# Patient Record
Sex: Female | Born: 2000 | Race: Black or African American | Hispanic: No | Marital: Single | Smoking: Current every day smoker
Health system: Southern US, Community
[De-identification: ages and names within clinical notes are randomized; demographics above are authoritative.]

## PROBLEM LIST (undated history)

## (undated) DIAGNOSIS — I10 Essential (primary) hypertension: Secondary | ICD-10-CM

## (undated) HISTORY — DX: Essential (primary) hypertension: I10

---

## 2020-02-07 ENCOUNTER — Ambulatory Visit: Payer: Medicaid Other | Attending: Family

## 2020-02-07 DIAGNOSIS — Z23 Encounter for immunization: Secondary | ICD-10-CM

## 2020-02-07 NOTE — Progress Notes (Signed)
   Covid-19 Vaccination Clinic  Name:  Kelly Arellano    MRN: [id number] DOB: September 25, 2001  02/07/2020  [name] was observed post Covid-19 immunization for 15 minutes without incident. She was provided with Vaccine Information Sheet and instruction to access the V-Safe system.   [name] was instructed to call 911 with any severe reactions post vaccine: Marland Kitchen Difficulty breathing  . Swelling of face and throat  . A fast heartbeat  . A bad rash all over body  . Dizziness and weakness   Immunizations Administered    Name Date Dose VIS Date Route   Moderna COVID-19 Vaccine 02/07/2020 12:38 PM 0.5 mL 10/09/2019 Intramuscular   Manufacturer: Moderna   Lot: [id number]   NDC: 36644-034-74

## 2020-03-11 ENCOUNTER — Ambulatory Visit: Payer: Medicaid Other | Attending: Family

## 2020-03-11 DIAGNOSIS — Z23 Encounter for immunization: Secondary | ICD-10-CM

## 2020-03-11 NOTE — Progress Notes (Signed)
   Covid-19 Vaccination Clinic  Name:  Kelly Arellano    MRN: [id number] DOB: [birth year]  03/11/2020  Kelly Arellano was observed post Covid-19 immunization for 15 minutes without incident. She was provided with Vaccine Information Sheet and instruction to access the V-Safe system.   Kelly Arellano was instructed to call 911 with any severe reactions post vaccine: Marland Kitchen Difficulty breathing  . Swelling of face and throat  . A fast heartbeat  . A bad rash all over body  . Dizziness and weakness   Immunizations Administered    Name Date Dose VIS Date Route   Moderna COVID-19 Vaccine 03/11/2020 10:14 AM 0.5 mL 10/2019 Intramuscular   Manufacturer: Moderna   Lot: [id number]   NDC: 50413-643-83

## 2020-11-20 ENCOUNTER — Ambulatory Visit: Payer: Medicaid Other

## 2020-11-25 ENCOUNTER — Ambulatory Visit: Payer: Medicaid Other

## 2020-11-27 ENCOUNTER — Ambulatory Visit: Payer: Medicaid Other | Attending: Internal Medicine

## 2020-11-27 DIAGNOSIS — Z23 Encounter for immunization: Secondary | ICD-10-CM

## 2020-11-27 NOTE — Progress Notes (Signed)
   Covid-19 Vaccination Clinic  Name:  Kelly Arellano    MRN: [id number] DOB: 2000/12/12  11/27/2020  Kelly Arellano was observed post Covid-19 immunization for 15 minutes without incident. She was provided with Vaccine Information Sheet and instruction to access the V-Safe system.   Kelly Arellano was instructed to call 911 with any severe reactions post vaccine: Marland Kitchen Difficulty breathing  . Swelling of face and throat  . A fast heartbeat  . A bad rash all over body  . Dizziness and weakness   Immunizations Administered    Name Date Dose VIS Date Route   Moderna Covid-19 Booster Vaccine 11/27/2020  1:38 PM 0.25 mL 08/27/2020 Intramuscular   Manufacturer: Moderna   Lot: [id number]   NDC: 35009-381-82

## 2021-01-06 ENCOUNTER — Emergency Department (HOSPITAL_COMMUNITY)
Admission: EM | Admit: 2021-01-06 | Discharge: 2021-01-06 | Disposition: A | Discharge status: Home / Self Care | Payer: Medicaid Other | Attending: Emergency Medicine | Admitting: Emergency Medicine

## 2021-01-06 ENCOUNTER — Other Ambulatory Visit: Payer: Self-pay

## 2021-01-06 ENCOUNTER — Encounter (HOSPITAL_COMMUNITY): Payer: Self-pay

## 2021-01-06 DIAGNOSIS — B349 Viral infection, unspecified: Secondary | ICD-10-CM | POA: Insufficient documentation

## 2021-01-06 DIAGNOSIS — Z8669 Personal history of other diseases of the nervous system and sense organs: Secondary | ICD-10-CM | POA: Diagnosis not present

## 2021-01-06 DIAGNOSIS — R509 Fever, unspecified: Secondary | ICD-10-CM

## 2021-01-06 DIAGNOSIS — Z20822 Contact with and (suspected) exposure to covid-19: Secondary | ICD-10-CM | POA: Diagnosis not present

## 2021-01-06 DIAGNOSIS — R519 Headache, unspecified: Secondary | ICD-10-CM | POA: Diagnosis present

## 2021-01-06 LAB — SARS CORONAVIRUS 2 (TAT 6-24 HRS): SARS Coronavirus 2: NEGATIVE

## 2021-01-06 MED ORDER — KETOROLAC TROMETHAMINE 60 MG/2ML IM SOLN
60.0000 mg | Freq: Once | INTRAMUSCULAR | Status: AC
  Administered 2021-01-06: 60 mg via INTRAMUSCULAR
  Filled 2021-01-06: qty 2

## 2021-01-06 MED ORDER — ACETAMINOPHEN 325 MG PO TABS
650.0000 mg | ORAL_TABLET | Freq: Four times a day (QID) | ORAL | Status: DC | PRN
  Administered 2021-01-06: 650 mg via ORAL
  Filled 2021-01-06: qty 2

## 2021-01-06 NOTE — Discharge Instructions (Addendum)
Return if any problems.  Tylenol every 4 hours  

## 2021-01-06 NOTE — ED Triage Notes (Signed)
Patient reports migraine starting on Saturday, reports she took 800 mg Advil without relief. Denies any sick contacts.

## 2021-01-06 NOTE — ED Provider Notes (Signed)
[facility] EMERGENCY DEPARTMENT Provider Note   CSN: [id number] Arrival date & time: 01/06/21  0645     History Chief Complaint  Patient presents with  . Migraine    Kelly Arellano is a 20 y.o. female.  The history is provided by the patient. No language interpreter was used.  Migraine This is a new problem. The current episode started yesterday. The problem occurs constantly. The problem has not changed since onset.Associated symptoms include headaches. Pertinent negatives include no chest pain. Nothing aggravates the symptoms. She has tried nothing for the symptoms. The treatment provided no relief.   [facility]     No past medical history on file.  There are no problems to display for this patient.      OB History   No obstetric history on file.     No family history on file.  Social History   Tobacco Use  . Smoking status: Never Smoker  . Smokeless tobacco: Never Used  Substance Use Topics  . Alcohol use: Never  . Drug use: Never    Home Medications Prior to Admission medications   Not on File    Allergies    Patient has no known allergies.  Review of Systems   Review of Systems  Cardiovascular: Negative for chest pain.  Neurological: Positive for headaches.  All other systems reviewed and are negative.   Physical Exam Updated Vital Signs BP (!) 150/96   Pulse (!) 113   Temp (!) 102.8 F (39.3 C) (Oral)   Resp (!) 21   Ht 5\' 4"  (1.626 m)   Wt 65.8 kg   LMP 12/14/2020   SpO2 100%   BMI 24.89 kg/m   Physical Exam Vitals and nursing note reviewed.  Constitutional:      Appearance: She is well-developed and well-nourished.  HENT:     Head: Normocephalic.     Nose: Nose normal.     Mouth/Throat:     Mouth: Mucous membranes are moist.  Eyes:     Extraocular Movements: EOM normal.  Cardiovascular:     Rate and Rhythm: Normal rate and regular rhythm.  Pulmonary:     Effort:  Pulmonary effort is normal.  Abdominal:     General: There is no distension.  Musculoskeletal:        General: Normal range of motion.     Cervical back: Normal range of motion.  Skin:    General: Skin is warm.  Neurological:     General: No focal deficit present.     Mental Status: She is alert and oriented to person, place, and time.  Psychiatric:        Mood and Affect: Mood and affect and mood normal.     ED Results / Procedures / Treatments   Labs (all labs ordered are listed, but only abnormal results are displayed) Labs Reviewed  SARS CORONAVIRUS 2 (TAT 6-24 HRS)    EKG EKG Interpretation  Date/Time:  Tuesday January 06 2021 06:50:28 EST Ventricular Rate:  129 PR Interval:  152 QRS Duration: 86 QT Interval:  286 QTC Calculation: 418 R Axis:   82 Text Interpretation: Sinus tachycardia Otherwise normal ECG no prior ECG for comparison. No STEMI Confirmed by [id number] (Theda Belfast) on 01/06/2021 8:48:46 AM   Radiology No results found.  Procedures Procedures   Medications Ordered in ED Medications  acetaminophen (TYLENOL) tablet 650 mg (650 mg Oral Given 01/06/21 0729)  ketorolac (TORADOL) injection  60 mg (60 mg Intramuscular Given 01/06/21 0730)    ED Course  I have reviewed the triage vital signs and the nursing notes.  Pertinent labs & imaging results that were available during my care of the patient were reviewed by me and considered in my medical decision making (see chart for details).    MDM Rules/Calculators/A&P                          MDM:  Covid pending.  Temp decreased to 100.7.  Pt reports headache has resolved.  Pt counseled on viral illness . Final Clinical Impression(s) / ED Diagnoses Final diagnoses:  Fever, unspecified fever cause  Viral illness    Rx / DC Orders ED Discharge Orders    None    An After Visit Summary was printed and given to the patient.    Elson Areas, [place] 01/06/21 8099    Tegeler, Canary Brim, MD 01/06/21  1556

## 2021-01-06 NOTE — ED Triage Notes (Signed)
Patient noted to be tachy and febrile in triage. States she was tested for flu and covid yesterday.

## 2021-01-07 ENCOUNTER — Other Ambulatory Visit: Payer: Self-pay

## 2021-01-07 ENCOUNTER — Emergency Department (HOSPITAL_COMMUNITY)
Admission: EM | Admit: 2021-01-07 | Discharge: 2021-01-08 | Disposition: A | Discharge status: Home / Self Care | Payer: Medicaid Other | Attending: Emergency Medicine | Admitting: Emergency Medicine

## 2021-01-07 ENCOUNTER — Encounter (HOSPITAL_COMMUNITY): Payer: Self-pay

## 2021-01-07 DIAGNOSIS — E860 Dehydration: Secondary | ICD-10-CM | POA: Diagnosis not present

## 2021-01-07 DIAGNOSIS — Z20822 Contact with and (suspected) exposure to covid-19: Secondary | ICD-10-CM | POA: Diagnosis not present

## 2021-01-07 DIAGNOSIS — R112 Nausea with vomiting, unspecified: Secondary | ICD-10-CM | POA: Insufficient documentation

## 2021-01-07 DIAGNOSIS — R809 Proteinuria, unspecified: Secondary | ICD-10-CM | POA: Diagnosis not present

## 2021-01-07 DIAGNOSIS — B349 Viral infection, unspecified: Secondary | ICD-10-CM

## 2021-01-07 DIAGNOSIS — R519 Headache, unspecified: Secondary | ICD-10-CM | POA: Diagnosis present

## 2021-01-07 DIAGNOSIS — H1132 Conjunctival hemorrhage, left eye: Secondary | ICD-10-CM | POA: Insufficient documentation

## 2021-01-07 LAB — COMPREHENSIVE METABOLIC PANEL
ALT: 47 U/L — ABNORMAL HIGH (ref 0–44)
AST: 85 U/L — ABNORMAL HIGH (ref 15–41)
Albumin: 3.4 g/dL — ABNORMAL LOW (ref 3.5–5.0)
Alkaline Phosphatase: 42 U/L (ref 38–126)
Anion gap: 10 (ref 5–15)
BUN: <5 mg/dL — ABNORMAL LOW (ref 6–20)
CO2: 18 mmol/L — ABNORMAL LOW (ref 22–32)
Calcium: 8.4 mg/dL — ABNORMAL LOW (ref 8.9–10.3)
Chloride: 101 mmol/L (ref 98–111)
Creatinine, Ser: 0.8 mg/dL (ref 0.44–1.00)
GFR, Estimated: >60 mL/min (ref >60)
Glucose, Bld: 100 mg/dL — ABNORMAL HIGH (ref 70–99)
Potassium: 3.1 mmol/L — ABNORMAL LOW (ref 3.5–5.1)
Sodium: 129 mmol/L — ABNORMAL LOW (ref 135–145)
Total Bilirubin: 0.8 mg/dL (ref 0.3–1.2)
Total Protein: 6.9 g/dL (ref 6.5–8.1)

## 2021-01-07 LAB — CBC
HCT: 30.4 % — ABNORMAL LOW (ref 36.0–46.0)
Hemoglobin: 10.2 g/dL — ABNORMAL LOW (ref 12.0–15.0)
MCH: 20.8 pg — ABNORMAL LOW (ref 26.0–34.0)
MCHC: 33.6 g/dL (ref 30.0–36.0)
MCV: 62 fL — ABNORMAL LOW (ref 80.0–100.0)
Platelets: 220 10*3/uL (ref 150–400)
RBC: 4.9 MIL/uL (ref 3.87–5.11)
RDW: 18.1 % — ABNORMAL HIGH (ref 11.5–15.5)
WBC: 3.6 10*3/uL — ABNORMAL LOW (ref 4.0–10.5)
nRBC: 0 % (ref 0.0–0.2)

## 2021-01-07 LAB — URINALYSIS, ROUTINE W REFLEX MICROSCOPIC
Bilirubin Urine: NEGATIVE
Glucose, UA: NEGATIVE mg/dL
Ketones, ur: 20 mg/dL — AB
Leukocytes,Ua: NEGATIVE
Nitrite: NEGATIVE
Protein, ur: >=300 mg/dL — AB
RBC / HPF: >50 RBC/hpf — ABNORMAL HIGH (ref 0–5)
Specific Gravity, Urine: 1.025 (ref 1.005–1.030)
pH: 5 (ref 5.0–8.0)

## 2021-01-07 LAB — LIPASE, BLOOD: Lipase: 25 U/L (ref 11–51)

## 2021-01-07 LAB — RESP PANEL BY RT-PCR (FLU A&B, COVID) ARPGX2
Influenza A by PCR: NEGATIVE
Influenza B by PCR: NEGATIVE
SARS Coronavirus 2 by RT PCR: NEGATIVE

## 2021-01-07 LAB — I-STAT BETA HCG BLOOD, ED (MC, WL, AP ONLY): I-stat hCG, quantitative: <5 m[IU]/mL (ref <5)

## 2021-01-07 MED ORDER — ACETAMINOPHEN 325 MG PO TABS
650.0000 mg | ORAL_TABLET | Freq: Once | ORAL | Status: AC
  Administered 2021-01-07: 650 mg via ORAL
  Filled 2021-01-07: qty 2

## 2021-01-07 NOTE — ED Triage Notes (Signed)
Pt comes for fever, n/v, seen yesterday for the same.

## 2021-01-08 MED ORDER — POTASSIUM CHLORIDE CRYS ER 20 MEQ PO TBCR: 20.0000 meq | EXTENDED_RELEASE_TABLET | Freq: Two times a day (BID) | ORAL | 0 refills | Status: AC

## 2021-01-08 MED ORDER — POTASSIUM CHLORIDE CRYS ER 20 MEQ PO TBCR: 20.0000 meq | EXTENDED_RELEASE_TABLET | Freq: Two times a day (BID) | ORAL | 0 refills | Status: DC

## 2021-01-08 MED ORDER — ONDANSETRON HCL 4 MG/2ML IJ SOLN
4.0000 mg | Freq: Once | INTRAMUSCULAR | Status: AC
  Administered 2021-01-08: 4 mg via INTRAVENOUS
  Filled 2021-01-08: qty 2

## 2021-01-08 MED ORDER — ONDANSETRON 4 MG PO TBDP: 4.0000 mg | ORAL_TABLET | Freq: Three times a day (TID) | ORAL | 0 refills | Status: DC | PRN

## 2021-01-08 MED ORDER — SODIUM CHLORIDE 0.9 % IV BOLUS
1000.0000 mL | Freq: Once | INTRAVENOUS | Status: AC
  Administered 2021-01-08: 1000 mL via INTRAVENOUS

## 2021-01-08 NOTE — ED Provider Notes (Signed)
[facility] Provider Note   CSN: [id number] Arrival date & time: 01/07/21  2100     History Chief Complaint  Patient presents with  . Fever    Kelly Arellano is a 20 y.o. female with a history of anemia who presents to the emergency department with chief complaint of vomiting.  The patient reports multiple episodes of nonbloody, nonbilious vomiting, onset 24 hours ago.  She reports that she has been unable to keep down even sips of fluids or any food since onset.  She reports that she noticed today red dot on her left eye after vomiting began earlier today.  She reports that she developed malaise approximately 4 days ago followed by headache and fever 48 hours ago.  Fever has been well controlled by Tylenol until she started vomiting.  She reports that headache has been gradually improving since onset.  She is also endorsing mild, diffuse, bilateral low back pain.  She denies neck pain or stiffness, numbness, weakness, chest pain, shortness of breath, visual changes, cough, hematemesis, dysuria, urinary frequency or hesitancy, vaginal discharge, abdominal pain, flank pain.  She reports that she started her menstrual cycle yesterday.  No known sick contacts.  She is fully vaccinated and boosted against COVID-19. No concerns for STIs.  No history of IV drug use.   The history is provided by the patient and medical records. No language interpreter was used.       History reviewed. No pertinent past medical history.  There are no problems to display for this patient.   History reviewed. No pertinent surgical history.   OB History   No obstetric history on file.     No family history on file.  Social History   Tobacco Use  . Smoking status: Never Smoker  . Smokeless tobacco: Never Used  Substance Use Topics  . Alcohol use: Never  . Drug use: Never    Home Medications Prior to Admission medications   Medication Sig Start Date End  Date Taking? Authorizing Provider  ibuprofen (ADVIL) 800 MG tablet Take 800 mg by mouth every 8 (eight) hours as needed for headache or moderate pain.   Yes [provider]  mometasone (NASONEX) 50 MCG/ACT nasal spray Place 2 sprays into the nose daily as needed (allergies). 01/22/19  Yes [provider]  montelukast (SINGULAIR) 5 MG chewable tablet Chew 5 mg by mouth at bedtime. 04/15/20  Yes [provider]  norethindrone-ethinyl estradiol (LOESTRIN FE) 1-20 MG-MCG tablet Take 1 tablet by mouth daily. 06/19/20 06/19/21 Yes [provider]  ondansetron (ZOFRAN ODT) 4 MG disintegrating tablet Take 1 tablet (4 mg total) by mouth every 8 (eight) hours as needed. 01/08/21  Yes Amour Trigg A, PA-C  potassium chloride SA (KLOR-CON) 20 MEQ tablet Take 1 tablet (20 mEq total) by mouth 2 (two) times daily for 5 days. 01/08/21 01/13/21  Taahir Grisby A, PA-C    Allergies    Molds & smuts and Pollen extract  Review of Systems   Review of Systems  Constitutional: Positive for chills and fever. Negative for activity change and diaphoresis.  HENT: Negative for congestion, sinus pressure, sinus pain, sore throat and voice change.   Eyes: Negative for visual disturbance.  Respiratory: Negative for cough, shortness of breath and wheezing.   Cardiovascular: Negative for chest pain and palpitations.  Gastrointestinal: Positive for nausea and vomiting. Negative for abdominal pain, blood in stool, constipation and diarrhea.  Genitourinary: Negative for dysuria, flank pain, frequency,  vaginal discharge and vaginal pain.  Musculoskeletal: Positive for back pain. Negative for arthralgias, gait problem, joint swelling, myalgias and neck stiffness.  Skin: Negative for rash and wound.  Allergic/Immunologic: Negative for immunocompromised state.  Neurological: Positive for headaches. Negative for dizziness, seizures, syncope, weakness and numbness.  Psychiatric/Behavioral: Negative for  confusion.    Physical Exam Updated Vital Signs BP 133/89 (BP Location: Left Arm)   Pulse 89   Temp 98.7 F (37.1 C) (Oral)   Resp 16   LMP 12/14/2020   SpO2 100%   Physical Exam Vitals and nursing note reviewed.  Constitutional:      General: She is not in acute distress.    Appearance: She is not ill-appearing, toxic-appearing or diaphoretic.     Comments: Well-appearing.  No acute distress.  HENT:     Head: Normocephalic.     Nose: Nose normal.     Mouth/Throat:     Mouth: Mucous membranes are moist.  Eyes:     Conjunctiva/sclera: Conjunctivae normal.     Comments: Subconjunctival hemorrhage noted at 9 o'clock on the left.   Cardiovascular:     Rate and Rhythm: Normal rate and regular rhythm.     Heart sounds: No murmur heard. No friction rub. No gallop.   Pulmonary:     Effort: Pulmonary effort is normal. No respiratory distress.     Breath sounds: No stridor. No wheezing, rhonchi or rales.  Chest:     Chest wall: No tenderness.  Abdominal:     General: There is no distension.     Palpations: Abdomen is soft. There is no mass.     Tenderness: There is no abdominal tenderness. There is no right CVA tenderness, left CVA tenderness, guarding or rebound.     Hernia: No hernia is present.     Comments: Abdomen is soft, nontender, nondistended.  No CVA tenderness bilaterally.  No rebound or guarding.  Hyperactive bowel sounds in the bilateral lower abdomen.  Musculoskeletal:     Cervical back: Neck supple.  Skin:    General: Skin is warm.     Capillary Refill: Capillary refill takes less than 2 seconds.     Findings: No rash.  Neurological:     Mental Status: She is alert.  Psychiatric:        Behavior: Behavior normal.     ED Results / Procedures / Treatments   Labs (all labs ordered are listed, but only abnormal results are displayed) Labs Reviewed  COMPREHENSIVE METABOLIC PANEL - Abnormal; Notable for the following components:      Result Value   Sodium  129 (*)    Potassium 3.1 (*)    CO2 18 (*)    Glucose, Bld 100 (*)    BUN <5 (*)    Calcium 8.4 (*)    Albumin 3.4 (*)    AST 85 (*)    ALT 47 (*)    All other components within normal limits  CBC - Abnormal; Notable for the following components:   WBC 3.6 (*)    Hemoglobin 10.2 (*)    HCT 30.4 (*)    MCV 62.0 (*)    MCH 20.8 (*)    RDW 18.1 (*)    All other components within normal limits  URINALYSIS, ROUTINE W REFLEX MICROSCOPIC - Abnormal; Notable for the following components:   Color, Urine AMBER (*)    APPearance HAZY (*)    Hgb urine dipstick LARGE (*)    Ketones, ur 20 (*)  Protein, ur >=300 (*)    RBC / HPF >50 (*)    Bacteria, UA RARE (*)    All other components within normal limits  RESP PANEL BY RT-PCR (FLU A&B, COVID) ARPGX2  LIPASE, BLOOD  I-STAT BETA HCG BLOOD, ED ([facility], [facility], [facility] ONLY)    EKG None  Radiology No results found.  Procedures Procedures   Medications Ordered in ED Medications  acetaminophen (TYLENOL) tablet 650 mg (650 mg Oral Given 01/07/21 2111)  sodium chloride 0.9 % bolus 1,000 mL (0 mLs Intravenous Stopped 01/08/21 0511)  ondansetron (ZOFRAN) injection 4 mg (4 mg Intravenous Given 01/08/21 0326)    ED Course  I have reviewed the triage vital signs and the nursing notes.  Pertinent labs & imaging results that were available during my care of the patient were reviewed by me and considered in my medical decision making (see chart for details).    MDM Rules/Calculators/A&P                          20 year old female with a history of anemia presenting with 24 hours of nausea and vomiting after developing fever and headache 3 days ago.  Headache has been significantly improving since onset.  Oral temp 100.1 on arrival.  No tachycardia.  Normotensive.  No hypoxia or tachypnea.  Patient is nontoxic-appearing.  Abdomen is benign.  Labs have been reviewed and independently interpreted by me.  Have suspicious for a viral process as the patient  has a leukopenia with a mildly elevated transaminases.  She does have a microcytic anemia, but this is chronic.  Mild hyponatremia of 129 and mild hypokalemia of 3.1, likely secondary to vomiting.  Bicarb is 18 and she appears dehydrated.  Creatinine is normal.  Pregnancy test is negative.  COVID-19 test is negative.  UA with large hemoglobinuria, likely secondary to her menstrual cycle.  She has mild ketonuria, which is likely due to vomiting.  She also has 3+ proteinuria, which is new when reviewing urinalysis from care everywhere.  No nitrates or leukocyte esterase.  She does have some mild pyuria.  She is having no genitourinary complaints, doubt pyelonephritis, obstructive uropathy, UTI, or PID.  Abdomen is benign and I am less suspicious for cholecystitis or appendicitis.  Could consider viral gastroenteritis, but it would be unusual that vomiting started on the third day after onset of fever.  Doubt meningitis, community-acquired pneumonia, ovarian torsion, diverticulitis, ectopic pregnancy, septic abortion.  Less suspicious for bacteremia given that constitutional symptoms really been present for 3 days.  The patient was treated with IV fluids and was given Zofran.  She was successfully fluid challenge.  Reports that she is feeling much improved.  Given suspicion for viral syndrome, will send home with symptomatic management.  She did have 3+ proteinuria on her urinalysis, which is new from previous.  I have advised her to follow-up with her PCP for repeat urinalysis within the next week.  Discussed the patient with Dr. Blinda Leatherwood, attending physician.  ER return precautions given at this time, she is hemodynamically stable and in no acute distress.  Safer discharge home with outpatient follow-up as indicated.  Final Clinical Impression(s) / ED Diagnoses Final diagnoses:  Viral syndrome  Proteinuria, unspecified type    Rx / DC Orders ED Discharge Orders         Ordered    ondansetron (ZOFRAN  ODT) 4 MG disintegrating tablet  Every 8 hours PRN  01/08/21 0454    potassium chloride SA (KLOR-CON) 20 MEQ tablet  2 times daily,   Status:  Discontinued        01/08/21 0458    potassium chloride SA (KLOR-CON) 20 MEQ tablet  2 times daily        01/08/21 0458           Frederik Pear A, PA-C 01/08/21 0518    Gilda Crease, MD 01/08/21 [phone]

## 2021-01-08 NOTE — Discharge Instructions (Addendum)
Thank you for allowing me to care for you today in the Emergency Department.   You were seen today for vomiting and fever.  Your work-up was concerning for dehydration and was suggestive of a viral illness.  You tested negative for COVID-19 influenza today.  Unfortunately, your pharmacy does not take electronic prescription so I have given you a paper prescription.    Let 1 tablet of Zofran dissolve under your tongue every 8 hours as needed for nausea and vomiting.  Your potassium level slightly low today.  This is likely from vomiting.  Take 1 tablet of potassium chloride 2 times daily for the next 5 days.  Take 650 mg of Tylenol or 600 mg of ibuprofen with food every 6 hours for pain or fever.  You can alternate between these 2 medications every 3 hours if your pain returns.  For instance, you can take Tylenol at noon, followed by a dose of ibuprofen at 3, followed by second dose of Tylenol and 6.  Given her history of anemia, you would probably benefit from taking an over-the-counter iron supplement.  Take 1 tablet of iron every other day.  Your urine today had some protein in it.  Please follow-up with your primary care provider to have a repeat urinalysis in the next week.  Return to the emergency department if you pass out, if you develop respiratory distress, severe abdominal pain, uncontrollable vomiting despite taking Zofran, or if you have other new, concerning symptoms.

## 2021-04-21 ENCOUNTER — Other Ambulatory Visit: Payer: Self-pay

## 2021-04-21 ENCOUNTER — Ambulatory Visit (HOSPITAL_COMMUNITY)
Admission: EM | Admit: 2021-04-21 | Discharge: 2021-04-21 | Disposition: A | Discharge status: Home / Self Care | Payer: Medicaid Other | Attending: Emergency Medicine | Admitting: Emergency Medicine

## 2021-04-21 ENCOUNTER — Encounter (HOSPITAL_COMMUNITY): Payer: Self-pay | Admitting: Emergency Medicine

## 2021-04-21 DIAGNOSIS — Z3201 Encounter for pregnancy test, result positive: Secondary | ICD-10-CM

## 2021-04-21 LAB — POC URINE PREG, ED: Preg Test, Ur: POSITIVE — AB

## 2021-04-21 MED ORDER — PRENATAL VITAMINS 28-0.8 MG PO TABS: 1.0000 | ORAL_TABLET | Freq: Every day | ORAL | 0 refills | Status: AC

## 2021-04-21 NOTE — ED Provider Notes (Signed)
HPI  SUBJECTIVE:  Kelly Arellano is a 20 y.o. female who presents for pregnancy testing.  She states that she had a negative urine pregnancy at home.  States that her menses have been lighter than usual for the past 2 months.  She reports fatigue and occasional nausea.  No breast soreness, tenderness, morning sickness, sensitivity to smells, urinary complaints, abdominal pain, pelvic pain, vaginal odor, discharge, bleeding, vaginal itching, genital rash, lower extremity edema, lower abdominal swelling.  She is on OCPs.  She is also on Flagyl for BV.  She has a past medical history of hypertension on hydrochlorothiazide.  She states that she took it this morning.  She does not check her blood pressure at home.  She has a past medical history of gonorrhea, chlamydia, BV.  No history of UTI, pyelonephritis, yeast infections, diabetes.  LMP: Ended yesterday.  She has never been pregnant before.  PMD: In [place].  She is a Consulting civil engineer here.   History reviewed. No pertinent past medical history.  History reviewed. No pertinent surgical history.  History reviewed. No pertinent family history.  Social History   Tobacco Use   Smoking status: Never   Smokeless tobacco: Never  Substance Use Topics   Alcohol use: Never   Drug use: Never    No current facility-administered medications for this encounter.  Current Outpatient Medications:    Prenatal Vit-Fe Fumarate-FA (PRENATAL VITAMINS) 28-0.8 MG TABS, Take 1 tablet by mouth daily., Disp: 30 tablet, Rfl: 0   mometasone (NASONEX) 50 MCG/ACT nasal spray, Place 2 sprays into the nose daily as needed (allergies)., Disp: , Rfl:    potassium chloride SA (KLOR-CON) 20 MEQ tablet, Take 1 tablet (20 mEq total) by mouth 2 (two) times daily for 5 days., Disp: 10 tablet, Rfl: 0  Allergies  Allergen Reactions   Molds & Smuts     Other reaction(s): Other Sneezing   Pollen Extract     Other reaction(s): Other sneezing     ROS  As noted in HPI.   Physical  Exam  BP (!) 145/105 (BP Location: Left Arm)   Pulse 75   Temp 99.1 F (37.3 C)   Resp 18   LMP 04/15/2021   SpO2 100%   Constitutional: Well developed, well nourished, no acute distress Eyes:  EOMI, conjunctiva normal bilaterally HENT: Normocephalic, atraumatic,mucus membranes moist Respiratory: Normal inspiratory effort Cardiovascular: Normal rate GI: nondistended soft, nontender.  No suprapubic, flank tenderness. Back: No CVAT skin: No rash, skin intact Musculoskeletal: no deformities Neurologic: Alert & oriented x 3, no focal neuro deficits Psychiatric: Speech and behavior appropriate   ED Course   Medications - No data to display  Orders Placed This Encounter  Procedures   Ambulatory referral to Obstetrics / Gynecology    Referral Priority:   Routine    Referral Type:   Consultation    Referral Reason:   Specialty Services Required    Requested Specialty:   Obstetrics and Gynecology    Number of Visits Requested:   1   POC Pregnancy, urine    Standing Status:   Standing    Number of Occurrences:   1    Results for orders placed or performed during the hospital encounter of 04/21/21 (from the past 24 hour(s))  POC Pregnancy, urine     Status: Abnormal   Collection Time: 04/21/21  6:04 PM  Result Value Ref Range   Preg Test, Ur POSITIVE (A) NEGATIVE   No results found.  ED Clinical Impression  1. Positive pregnancy test      ED Assessment/Plan  Patient urine pregnancy is negative.  She has no urinary complaints, thus a urine was not done.  Denies any vaginal bleeding, abdominal or pelvic pain.  She is currently on Flagyl for BV, advised her to finish it.  we will start her on prenatal vitamins and have her follow-up with [facility].  ER return precautions given.   Discussed labs, MDM, treatment plan, and plan for follow-up with patient. Discussed sn/sx that should prompt return to the ED. patient agrees with plan.   Meds ordered this  encounter  Medications   Prenatal Vit-Fe Fumarate-FA (PRENATAL VITAMINS) 28-0.8 MG TABS    Sig: Take 1 tablet by mouth daily.    Dispense:  30 tablet    Refill:  0      *This clinic note was created using Scientist, clinical (histocompatibility and immunogenetics). Therefore, there may be occasional mistakes despite careful proofreading.  ?    Domenick Gong, MD 04/22/21 [phone]

## 2021-04-21 NOTE — Discharge Instructions (Addendum)
I have placed referral to [facility].  You can also follow-up with [facility].  Go to whoever can get you in first.  Try the prenatal vitamins.  Stop the birth control pills.  Go immediately to [facility] off of [facility] for abdominal pain, vaginal bleeding, or for any concerns.  I Believe it is entrance E to the hospital

## 2021-04-21 NOTE — ED Triage Notes (Signed)
Patient presents to [facility] for evaluation for possible pregnancy test.  States she has been having intermittent episodes of feeling fatigued, getting tired when she is standing to the point that she wants to sit down, and nausea.  LMP 1 week ago

## 2021-04-25 ENCOUNTER — Encounter (HOSPITAL_COMMUNITY): Payer: Self-pay | Admitting: *Deleted

## 2021-04-25 ENCOUNTER — Other Ambulatory Visit: Payer: Self-pay

## 2021-04-25 ENCOUNTER — Emergency Department (HOSPITAL_COMMUNITY)
Admission: EM | Admit: 2021-04-25 | Discharge: 2021-04-25 | Disposition: A | Discharge status: Home / Self Care | Payer: Medicaid Other | Attending: Emergency Medicine | Admitting: Emergency Medicine

## 2021-04-25 ENCOUNTER — Emergency Department (HOSPITAL_COMMUNITY): Payer: Medicaid Other

## 2021-04-25 DIAGNOSIS — Z3202 Encounter for pregnancy test, result negative: Secondary | ICD-10-CM | POA: Diagnosis not present

## 2021-04-25 DIAGNOSIS — F419 Anxiety disorder, unspecified: Secondary | ICD-10-CM | POA: Diagnosis not present

## 2021-04-25 DIAGNOSIS — R109 Unspecified abdominal pain: Secondary | ICD-10-CM

## 2021-04-25 DIAGNOSIS — R110 Nausea: Secondary | ICD-10-CM

## 2021-04-25 DIAGNOSIS — R1031 Right lower quadrant pain: Secondary | ICD-10-CM | POA: Insufficient documentation

## 2021-04-25 DIAGNOSIS — R1110 Vomiting, unspecified: Secondary | ICD-10-CM | POA: Diagnosis not present

## 2021-04-25 DIAGNOSIS — R1032 Left lower quadrant pain: Secondary | ICD-10-CM | POA: Diagnosis not present

## 2021-04-25 LAB — URINALYSIS, ROUTINE W REFLEX MICROSCOPIC
Bacteria, UA: NONE SEEN
Bilirubin Urine: NEGATIVE
Glucose, UA: NEGATIVE mg/dL
Hgb urine dipstick: NEGATIVE
Ketones, ur: 80 mg/dL — AB
Leukocytes,Ua: NEGATIVE
Nitrite: NEGATIVE
Protein, ur: 30 mg/dL — AB
Specific Gravity, Urine: 1.027 (ref 1.005–1.030)
pH: 7 (ref 5.0–8.0)

## 2021-04-25 LAB — COMPREHENSIVE METABOLIC PANEL
ALT: 20 U/L (ref 0–44)
AST: 29 U/L (ref 15–41)
Albumin: 4.6 g/dL (ref 3.5–5.0)
Alkaline Phosphatase: 49 U/L (ref 38–126)
Anion gap: 15 (ref 5–15)
BUN: 8 mg/dL (ref 6–20)
CO2: 20 mmol/L — ABNORMAL LOW (ref 22–32)
Calcium: 9.4 mg/dL (ref 8.9–10.3)
Chloride: 101 mmol/L (ref 98–111)
Creatinine, Ser: 0.7 mg/dL (ref 0.44–1.00)
GFR, Estimated: >60 mL/min (ref >60)
Glucose, Bld: 121 mg/dL — ABNORMAL HIGH (ref 70–99)
Potassium: 3.5 mmol/L (ref 3.5–5.1)
Sodium: 136 mmol/L (ref 135–145)
Total Bilirubin: 0.4 mg/dL (ref 0.3–1.2)
Total Protein: 8.7 g/dL — ABNORMAL HIGH (ref 6.5–8.1)

## 2021-04-25 LAB — LIPASE, BLOOD: Lipase: 25 U/L (ref 11–51)

## 2021-04-25 LAB — CBC
HCT: 34.3 % — ABNORMAL LOW (ref 36.0–46.0)
Hemoglobin: 11.4 g/dL — ABNORMAL LOW (ref 12.0–15.0)
MCH: 21.8 pg — ABNORMAL LOW (ref 26.0–34.0)
MCHC: 33.2 g/dL (ref 30.0–36.0)
MCV: 65.6 fL — ABNORMAL LOW (ref 80.0–100.0)
Platelets: 415 10*3/uL — ABNORMAL HIGH (ref 150–400)
RBC: 5.23 MIL/uL — ABNORMAL HIGH (ref 3.87–5.11)
RDW: 19.1 % — ABNORMAL HIGH (ref 11.5–15.5)
WBC: 9.6 10*3/uL (ref 4.0–10.5)
nRBC: 0 % (ref 0.0–0.2)

## 2021-04-25 LAB — I-STAT BETA HCG BLOOD, ED (MC, WL, AP ONLY): I-stat hCG, quantitative: <5 m[IU]/mL (ref <5)

## 2021-04-25 LAB — HCG, QUANTITATIVE, PREGNANCY: hCG, Beta Chain, Quant, S: <1 m[IU]/mL (ref <5)

## 2021-04-25 MED ORDER — ONDANSETRON 4 MG PO TBDP: 4.0000 mg | ORAL_TABLET | Freq: Three times a day (TID) | ORAL | 0 refills | Status: DC | PRN

## 2021-04-25 MED ORDER — ACETAMINOPHEN 325 MG PO TABS
650.0000 mg | ORAL_TABLET | Freq: Once | ORAL | Status: AC
  Administered 2021-04-25: 650 mg via ORAL
  Filled 2021-04-25: qty 2

## 2021-04-25 MED ORDER — ONDANSETRON HCL 4 MG/2ML IJ SOLN
4.0000 mg | Freq: Once | INTRAMUSCULAR | Status: AC
  Administered 2021-04-25: 4 mg via INTRAVENOUS
  Filled 2021-04-25: qty 2

## 2021-04-25 MED ORDER — SODIUM CHLORIDE 0.9 % IV BOLUS
1000.0000 mL | Freq: Once | INTRAVENOUS | Status: AC
  Administered 2021-04-25: 1000 mL via INTRAVENOUS

## 2021-04-25 NOTE — ED Triage Notes (Signed)
Pt complains of abdominal pain and vomiting since last night. She is [redacted] week pregnant. She reports drinking "a lot" of alcohol last night and smoked marijuana.

## 2021-04-25 NOTE — ED Notes (Signed)
Pt continues to take b/p and O2 sensor off

## 2021-04-25 NOTE — ED Provider Notes (Signed)
[facility]-EMERGENCY DEPT Provider Note   CSN: [id number] Arrival date & time: 04/25/21  0704     History Chief Complaint  Patient presents with   Abdominal Pain    Kelly Arellano is a 20 y.o. female G1, P0 with no known past medical history.  No abdominal surgical history.   Abdominal Pain Patient presenting to emergency room today with chief complaint of abdominal pain and vomiting x1 day.  Patient admits to drinking "a lot" of alcohol and smoking marijuana last night.  She has vomited over 10 times.  She states emesis has been nonbloody nonbilious.    She is reporting lower abdominal cramping that has been intermittent.  Pain is 8 out of 10 in severity when present.  Pain does not radiate to her back or groin. She has not taken any over-the-counter medications prior to arrival.  Patient admits to feeling very anxious as well.  She denies any thoughts of self-harm to herself or others. Patient was seen at urgent care x4 days ago and had a positive urine pregnancy test. This is her first pregnancy.  Patient states she is not planning to keep the pregnancy.  Her last menstrual cycle ended 10 days ago so she has no idea how far along she is. UC note commented on patient taking flagyl for BV. She states she finished the prescription 3 days ago.  Patient denies any fever, chills, chest pain, back pain, gross hematuria, urinary frequency, dysuria, vaginal bleeding, vaginal discharge, diarrhea.  No suspicious food intake or known sick contacts.   Chart review shows patient is scheduled [facility] on 04/29/2021.  History reviewed. No pertinent past medical history.  There are no problems to display for this patient.   History reviewed. No pertinent surgical history.   OB History     Gravida  1   Para      Term      Preterm      AB      Living         SAB      IAB      Ectopic      Multiple       Live Births              No family history on file.  Social History   Tobacco Use   Smoking status: Never   Smokeless tobacco: Never  Substance Use Topics   Alcohol use: Never   Drug use: Never    Home Medications Prior to Admission medications   Medication Sig Start Date End Date Taking? Authorizing Provider  ondansetron (ZOFRAN ODT) 4 MG disintegrating tablet Take 1 tablet (4 mg total) by mouth every 8 (eight) hours as needed for nausea or vomiting. 04/25/21  Yes Walisiewicz, Byrl Latin E, PA-C  mometasone (NASONEX) 50 MCG/ACT nasal spray Place 2 sprays into the nose daily as needed (allergies). 01/22/19   [provider]  potassium chloride SA (KLOR-CON) 20 MEQ tablet Take 1 tablet (20 mEq total) by mouth 2 (two) times daily for 5 days. 01/08/21 01/13/21  McDonald, Mia A, PA-C  Prenatal Vit-Fe Fumarate-FA (PRENATAL VITAMINS) 28-0.8 MG TABS Take 1 tablet by mouth daily. 04/21/21   Domenick Gong, MD    Allergies    Molds & smuts and Pollen extract  Review of Systems   Review of Systems  Gastrointestinal:  Positive for abdominal pain.  All other systems are  reviewed and are negative for acute change except as noted in the HPI.  Physical Exam Updated Vital Signs BP (!) 141/94 (BP Location: Left Arm)   Pulse 86   Temp 98.3 F (36.8 C) (Oral)   Resp (!) 22   LMP 04/15/2021   SpO2 100%   Physical Exam Vitals and nursing note reviewed.  Constitutional:      General: She is not in acute distress.    Appearance: She is not ill-appearing.     Comments: Patient is rolling around on the stretcher during exam stating she was unable to get comfortable  HENT:     Head: Normocephalic and atraumatic.     Right Ear: Tympanic membrane and external ear normal.     Left Ear: Tympanic membrane and external ear normal.     Nose: Nose normal.     Mouth/Throat:     Mouth: Mucous membranes are moist.     Pharynx: Oropharynx is clear.  Eyes:     General: No scleral icterus.        Right eye: No discharge.        Left eye: No discharge.     Extraocular Movements: Extraocular movements intact.     Conjunctiva/sclera: Conjunctivae normal.     Pupils: Pupils are equal, round, and reactive to light.  Neck:     Vascular: No JVD.  Cardiovascular:     Rate and Rhythm: Normal rate and regular rhythm.     Pulses: Normal pulses.          Radial pulses are 2+ on the right side and 2+ on the left side.     Heart sounds: Normal heart sounds.  Pulmonary:     Comments: Lungs clear to auscultation in all fields. Symmetric chest rise. No wheezing, rales, or rhonchi. Abdominal:     Comments: Abdomen is soft, non-distended.  Mild tenderness palpation of bilateral lower quadrants.  No rigidity, no guarding. No peritoneal signs.  No CVA tenderness.  Musculoskeletal:        General: Normal range of motion.     Cervical back: Normal range of motion.  Skin:    General: Skin is warm and dry.     Capillary Refill: Capillary refill takes less than 2 seconds.  Neurological:     Mental Status: She is oriented to person, place, and time.     GCS: GCS eye subscore is 4. GCS verbal subscore is 5. GCS motor subscore is 6.     Comments: Fluent speech, no facial droop.  Psychiatric:        Mood and Affect: Mood is anxious.        Behavior: Behavior normal.    ED Results / Procedures / Treatments   Labs (all labs ordered are listed, but only abnormal results are displayed) Labs Reviewed  COMPREHENSIVE METABOLIC PANEL - Abnormal; Notable for the following components:      Result Value   CO2 20 (*)    Glucose, Bld 121 (*)    Total Protein 8.7 (*)    All other components within normal limits  CBC - Abnormal; Notable for the following components:   RBC 5.23 (*)    Hemoglobin 11.4 (*)    HCT 34.3 (*)    MCV 65.6 (*)    MCH 21.8 (*)    RDW 19.1 (*)    Platelets 415 (*)    All other components within normal limits  URINALYSIS, ROUTINE W REFLEX MICROSCOPIC - Abnormal; Notable for  the following components:   APPearance HAZY (*)    Ketones, ur 80 (*)    Protein, ur 30 (*)    All other components within normal limits  LIPASE, BLOOD  HCG, QUANTITATIVE, PREGNANCY  I-STAT BETA HCG BLOOD, ED ([facility], [facility], [facility] ONLY)    EKG None  Radiology US PELVIC COMPLETE WITH TRANSVAGINAL  Result Date: 04/25/2021 CLINICAL DATA:  Patient reports positive pregnancy test.  Nausea. EXAM: TRANSABDOMINAL AND TRANSVAGINAL ULTRASOUND OF PELVIS TECHNIQUE: Both transabdominal and transvaginal ultrasound examinations of the pelvis were performed. Transabdominal technique was performed for global imaging of the pelvis including uterus, ovaries, adnexal regions, and pelvic cul-de-sac. It was necessary to proceed with endovaginal exam following the transabdominal exam to visualize the endometrium. COMPARISON:  None FINDINGS: Uterus Measurements: 7.9 x 4.0 x 5.1 cm = volume: 85 mL. No fibroids or other mass visualized. Endometrium Thickness: 7.  No focal abnormality visualized. Right ovary Measurements: 2.7 x 2.1 x 2.3 cm = volume: 7 mL. Normal appearance/no adnexal mass. Left ovary Measurements: 3.5 x 2.2 x 2.6 cm = volume: 10 mL. Normal appearance/no adnexal mass. Other findings Small amount of free fluid in the pelvis. IMPRESSION: No acute process within the pelvis. No intrauterine gestation identified. If the patient is pregnant based upon laboratory analysis, this represents pregnancy of unknown location. In the setting of positive pregnancy test and no definite intrauterine pregnancy, this reflects a pregnancy of unknown location. Differential considerations include early normal IUP, abnormal IUP, or nonvisualized ectopic pregnancy. Differentiation is achieved with serial beta HCG supplemented by repeat sonography as clinically warranted. Electronically Signed   By: Annia Beltrew  Davis M.D.   On: 04/25/2021 09:12    Procedures Procedures   Medications Ordered in ED Medications  ondansetron (ZOFRAN) injection 4 mg  (4 mg Intravenous Given 04/25/21 0742)  sodium chloride 0.9 % bolus 1,000 mL (0 mLs Intravenous Stopped 04/25/21 0823)  acetaminophen (TYLENOL) tablet 650 mg (650 mg Oral Given 04/25/21 [id number])    ED Course  I have reviewed the triage vital signs and the nursing notes.  Pertinent labs & imaging results that were available during my care of the patient were reviewed by me and considered in my medical decision making (see chart for details).    MDM Rules/Calculators/A&P                          History provided by patient with additional history obtained from chart review.    Patient presenting with abdominal pain and vomiting in early pregnancy after alcohol and marijuana use last night.  On exam patient is rolling around on the stretcher and is uncomfortable appearing although nontoxic.  She has very mild tenderness palpation of bilateral lower abdominal quadrants, no peritoneal signs.  No CVA tenderness.  As patient had recent positive pregnancy test will need to rule out ectopic pregnancy with ultrasound here.  It is possible that patient is very early in pregnancy as she reports recent LMP 10 days ago therefore hCG quant was collected as well establish baseline for outpatient trending. Patient given Tylenol and Zofran with a liter of fluids.  Discussed the risks of Zofran in first trimester however patient is adamant she is not keeping the pregnancy and would like to have nausea medicine immediately.  CBC without leukocytosis, hemoglobin consistent with baseline.  Patient has no document history of anemia although previous lab work is consistent with that. CMP without significant left light derangement, no renal insufficiency.  Normal anion gap. Lipase within normal range. UA without signs of infection.  Does have 80 ketones suggesting dehydration. hCG quant is negative. Ultrasound shows no acute processes.  No IUP which extends with the negative quant today.  Patient has had no vaginal bleeding since  positive pregnancy test.  On reassessment patient is feeling much better.  Updated her on all results.  She is tolerating p.o. intake here.  Serial abdominal exams are benign.  Patient to be discharged home to follow-up outpatient with PCP or GYN.  Strict return precautions were discussed.  Patient agreeable with plan of care.   Portions of this note were generated with Scientist, clinical (histocompatibility and immunogenetics). Dictation errors may occur despite best attempts at proofreading.    Final Clinical Impression(s) / ED Diagnoses Final diagnoses:  Abdominal pain, unspecified abdominal location  Negative pregnancy test    Rx / DC Orders ED Discharge Orders          Ordered    ondansetron (ZOFRAN ODT) 4 MG disintegrating tablet  Every 8 hours PRN        04/25/21 1020             Shanon Ace, PA-C 04/25/21 1032    Bethann Berkshire, MD 04/27/21 1001

## 2021-04-25 NOTE — Discharge Instructions (Addendum)
-  Your blood test today was negative for pregnancy.  The ultrasound also did not show any signs of pregnancy.  [facility] should [facility] the [facility] let [facility] know you had negative pregnancy blood test today with a normal ultrasound.  They might want to see you for follow-up still or will cancel your appointment.   Prescription printed for Zofran.  This is for nausea.  Take if needed.  He can also take Tylenol for pain.  Your lab work today shows you are dehydrated to make sure you drink plenty of water over the next several days.

## 2021-04-29 ENCOUNTER — Telehealth: Payer: Medicaid Other

## 2021-04-29 ENCOUNTER — Telehealth: Payer: Self-pay | Admitting: *Deleted

## 2021-04-29 NOTE — Telephone Encounter (Signed)
Kelly Arellano scheduled for New OB Intake today, per chart review had 2 visits to ED. Had + urine pregnancy test 04/18/21 . Then had ED visit 6/;18/22 for pain and had bhcg less than 5= negative and [place] that did not show a pregnancy. Reviewed with Dr. Donavan Foil and decided patient is no longer pregnant and we will cancel new ob intake. Will call patient to inform her that she is no longer pregnant and may make appointment if she is having any issues or desires birth control.  I called her mobile number and heard message " call is unable to be completed at this time.  ". I called her home number and left a message I am calling about her appointment and we do need to cancel this appointment. I will send a detailed Mychart message- if you have questions, please contact us. Brigit Doke,RN

## 2021-07-24 ENCOUNTER — Emergency Department (HOSPITAL_COMMUNITY)
Admission: EM | Admit: 2021-07-24 | Discharge: 2021-07-25 | Disposition: A | Discharge status: Home / Self Care | Payer: Medicaid Other | Attending: Emergency Medicine | Admitting: Emergency Medicine

## 2021-07-24 ENCOUNTER — Other Ambulatory Visit: Payer: Self-pay

## 2021-07-24 ENCOUNTER — Emergency Department (HOSPITAL_COMMUNITY): Payer: Medicaid Other

## 2021-07-24 ENCOUNTER — Encounter (HOSPITAL_COMMUNITY): Payer: Self-pay | Admitting: Emergency Medicine

## 2021-07-24 DIAGNOSIS — Z20822 Contact with and (suspected) exposure to covid-19: Secondary | ICD-10-CM | POA: Diagnosis not present

## 2021-07-24 DIAGNOSIS — R509 Fever, unspecified: Secondary | ICD-10-CM | POA: Diagnosis present

## 2021-07-24 DIAGNOSIS — D72829 Elevated white blood cell count, unspecified: Secondary | ICD-10-CM | POA: Diagnosis not present

## 2021-07-24 DIAGNOSIS — M791 Myalgia, unspecified site: Secondary | ICD-10-CM | POA: Insufficient documentation

## 2021-07-24 DIAGNOSIS — J029 Acute pharyngitis, unspecified: Secondary | ICD-10-CM | POA: Insufficient documentation

## 2021-07-24 LAB — CBC WITH DIFFERENTIAL/PLATELET
Abs Immature Granulocytes: 0.06 10*3/uL (ref 0.00–0.07)
Basophils Absolute: 0 10*3/uL (ref 0.0–0.1)
Basophils Relative: 0 %
Eosinophils Absolute: 0 10*3/uL (ref 0.0–0.5)
Eosinophils Relative: 0 %
HCT: 29.5 % — ABNORMAL LOW (ref 36.0–46.0)
Hemoglobin: 10.1 g/dL — ABNORMAL LOW (ref 12.0–15.0)
Immature Granulocytes: 0 %
Lymphocytes Relative: 7 %
Lymphs Abs: 0.9 10*3/uL (ref 0.7–4.0)
MCH: 23.5 pg — ABNORMAL LOW (ref 26.0–34.0)
MCHC: 34.2 g/dL (ref 30.0–36.0)
MCV: 68.6 fL — ABNORMAL LOW (ref 80.0–100.0)
Monocytes Absolute: 0.8 10*3/uL (ref 0.1–1.0)
Monocytes Relative: 6 %
Neutro Abs: 11.7 10*3/uL — ABNORMAL HIGH (ref 1.7–7.7)
Neutrophils Relative %: 87 %
Platelets: 343 10*3/uL (ref 150–400)
RBC: 4.3 MIL/uL (ref 3.87–5.11)
RDW: 17.9 % — ABNORMAL HIGH (ref 11.5–15.5)
WBC: 13.4 10*3/uL — ABNORMAL HIGH (ref 4.0–10.5)
nRBC: 0 % (ref 0.0–0.2)

## 2021-07-24 LAB — BASIC METABOLIC PANEL
Anion gap: 10 (ref 5–15)
BUN: 7 mg/dL (ref 6–20)
CO2: 19 mmol/L — ABNORMAL LOW (ref 22–32)
Calcium: 9.3 mg/dL (ref 8.9–10.3)
Chloride: 105 mmol/L (ref 98–111)
Creatinine, Ser: 0.78 mg/dL (ref 0.44–1.00)
GFR, Estimated: >60 mL/min (ref >60)
Glucose, Bld: 95 mg/dL (ref 70–99)
Potassium: 3.2 mmol/L — ABNORMAL LOW (ref 3.5–5.1)
Sodium: 134 mmol/L — ABNORMAL LOW (ref 135–145)

## 2021-07-24 LAB — I-STAT BETA HCG BLOOD, ED (MC, WL, AP ONLY): I-stat hCG, quantitative: <5 m[IU]/mL (ref <5)

## 2021-07-24 LAB — RESP PANEL BY RT-PCR (FLU A&B, COVID) ARPGX2
Influenza A by PCR: NEGATIVE
Influenza B by PCR: NEGATIVE
SARS Coronavirus 2 by RT PCR: NEGATIVE

## 2021-07-24 MED ORDER — ACETAMINOPHEN 500 MG PO TABS
1000.0000 mg | ORAL_TABLET | Freq: Once | ORAL | Status: AC
  Administered 2021-07-24: 1000 mg via ORAL
  Filled 2021-07-24: qty 2

## 2021-07-24 MED ORDER — ACETAMINOPHEN 325 MG PO TABS: 325.0000 mg | ORAL_TABLET | Freq: Once | ORAL | Status: DC

## 2021-07-24 NOTE — ED Triage Notes (Signed)
Pt reports fever, general body aches and feeling poorly

## 2021-07-24 NOTE — ED Provider Notes (Signed)
Emergency Medicine Provider Triage Evaluation Note  Kelly Arellano , a 20 y.o. female  was evaluated in triage.  Pt complains of fever, body aches, headache, and occasional shortness of breath.  The body aches started about 2 weeks ago, the other symptoms started today. She is COVID vaccinated, states she works 2 different jobs and has been under a lot of stress and not eating and drinking well..  Review of Systems  Positive: Body aches, headache, shortness of breath Negative: Chest pain  Physical Exam  BP (!) 145/95   Pulse (!) 109   Temp (!) 102.6 F (39.2 C) (Oral)   Resp 16   LMP 04/15/2021   SpO2 100%  Gen:   Awake, no distress   Resp:  Normal effort  MSK:   Moves extremities without difficulty  Other:    Medical Decision Making  Medically screening exam initiated at 8:07 PM.  Appropriate orders placed.  Kelly Arellano was informed that the remainder of the evaluation will be completed by another provider, this initial triage assessment does not replace that evaluation, and the importance of remaining in the ED until their evaluation is complete.  Will check electrolytes given poor intake. Body aches for 2 weeks, could be due to electrolyte abnormality.  patient is mildly tachycardic and has a temperature.  I suspect her symptoms are likely due to COVID or viral pathology. Will give tylenol.    Theron Arista, PA-C 07/24/21 2009    Gloris Manchester, MD 07/26/21 [phone]

## 2021-07-25 LAB — URINALYSIS, ROUTINE W REFLEX MICROSCOPIC
Bacteria, UA: NONE SEEN
Bilirubin Urine: NEGATIVE
Glucose, UA: NEGATIVE mg/dL
Ketones, ur: 80 mg/dL — AB
Nitrite: NEGATIVE
Protein, ur: 30 mg/dL — AB
Specific Gravity, Urine: 1.031 — ABNORMAL HIGH (ref 1.005–1.030)
pH: 5 (ref 5.0–8.0)

## 2021-07-25 LAB — GROUP A STREP BY PCR: Group A Strep by PCR: NOT DETECTED

## 2021-07-25 MED ORDER — CEPHALEXIN 500 MG PO CAPS: 500.0000 mg | ORAL_CAPSULE | Freq: Three times a day (TID) | ORAL | 0 refills | Status: AC

## 2021-07-25 MED ORDER — CEPHALEXIN 250 MG PO CAPS
500.0000 mg | ORAL_CAPSULE | Freq: Once | ORAL | Status: AC
  Administered 2021-07-25: 500 mg via ORAL
  Filled 2021-07-25: qty 2

## 2021-07-25 MED ORDER — IBUPROFEN 400 MG PO TABS
600.0000 mg | ORAL_TABLET | Freq: Once | ORAL | Status: AC
  Administered 2021-07-25: 600 mg via ORAL
  Filled 2021-07-25: qty 1

## 2021-07-25 NOTE — ED Notes (Signed)
Ambulated to restroom without assistance UA sample obtained

## 2021-07-25 NOTE — ED Provider Notes (Addendum)
[facility] EMERGENCY DEPARTMENT Provider Note  CSN: [id number] Arrival date & time: 07/24/21 1949  Chief Complaint(s) Fever and Generalized Body Aches  HPI Kelly Arellano is a 20 y.o. female here for several body aches attributed to working 2 different jobs and being on a lot of stress.  Patient developed fever, sore throat yesterday.  Fevers relieved with Tylenol.  No other alleviating or aggravating factors.  No associated nausea or vomiting.  She is endorsing mild sinus pressure.  No otalgia.  No nuchal rigidity.  No chest pain or shortness of breath.  No coughing or congestion.  No abdominal pain.  The history is provided by the patient.   Past Medical History History reviewed. No pertinent past medical history. There are no problems to display for this patient.  Home Medication(s) Prior to Admission medications   Medication Sig Start Date End Date Taking? Authorizing Provider  cephALEXin (KEFLEX) 500 MG capsule Take 1 capsule (500 mg total) by mouth 3 (three) times daily for 5 days. 07/25/21 07/30/21 Yes Cheynne Virden, Amadeo Garnet, MD  hydrochlorothiazide (HYDRODIURIL) 50 MG tablet Take 50 mg by mouth daily. 03/31/21  Yes [provider]  mometasone (NASONEX) 50 MCG/ACT nasal spray Place 2 sprays into the nose daily as needed (allergies). 01/22/19  Yes [provider]  montelukast (SINGULAIR) 10 MG tablet Take 10 mg by mouth daily.   Yes [provider]  norethindrone-ethinyl estradiol-FE (LOESTRIN FE) 1-20 MG-MCG tablet Take 1 tablet by mouth daily. 05/06/21  Yes [provider]  ondansetron (ZOFRAN ODT) 4 MG disintegrating tablet Take 1 tablet (4 mg total) by mouth every 8 (eight) hours as needed for nausea or vomiting. Patient not taking: Reported on 07/25/2021 04/25/21   Namon Cirri E, PA-C  potassium chloride SA (KLOR-CON) 20 MEQ tablet Take 1 tablet (20 mEq total) by mouth 2 (two) times daily for 5 days. Patient not taking:  Reported on 07/25/2021 01/08/21 01/13/21  McDonald, Lyman Balingit Earls A, PA-C  Prenatal Vit-Fe Fumarate-FA (PRENATAL VITAMINS) 28-0.8 MG TABS Take 1 tablet by mouth daily. Patient not taking: Reported on 07/25/2021 04/21/21   Domenick Gong, MD                                                                                                                                    Past Surgical History History reviewed. No pertinent surgical history. Family History No family history on file.  Social History Social History   Tobacco Use   Smoking status: Never   Smokeless tobacco: Never  Substance Use Topics   Alcohol use: Never   Drug use: Never   Allergies Molds & smuts and Pollen extract  Review of Systems Review of Systems All other systems are reviewed and are negative for acute change except as noted in the HPI  Physical Exam Vital Signs  I have reviewed the triage vital signs BP 134/73   Pulse 89   Temp  98.1 F (36.7 C) (Oral)   Resp 16   Ht 5\' 4"  (1.626 m)   Wt 64.9 kg   LMP 07/09/2021   SpO2 100%   BMI 24.55 kg/m   Physical Exam Vitals reviewed.  Constitutional:      General: She is not in acute distress.    Appearance: She is well-developed. She is not diaphoretic.  HENT:     Head: Normocephalic and atraumatic.     Right Ear: Tympanic membrane normal.     Left Ear: Tympanic membrane normal.     Nose: Nose normal.     Mouth/Throat:     Pharynx: Posterior oropharyngeal erythema (mild) present.  Eyes:     General: No scleral icterus.       Right eye: No discharge.        Left eye: No discharge.     Conjunctiva/sclera: Conjunctivae normal.     Pupils: Pupils are equal, round, and reactive to light.  Cardiovascular:     Rate and Rhythm: Normal rate and regular rhythm.     Heart sounds: No murmur heard.   No friction rub. No gallop.  Pulmonary:     Effort: Pulmonary effort is normal. No respiratory distress.     Breath sounds: Normal breath sounds. No stridor. No rales.   Abdominal:     General: There is no distension.     Palpations: Abdomen is soft.     Tenderness: There is no abdominal tenderness.  Musculoskeletal:        General: No tenderness.     Cervical back: Normal range of motion and neck supple.  Skin:    General: Skin is warm and dry.     Findings: No erythema or rash.  Neurological:     Mental Status: She is alert and oriented to person, place, and time.    ED Results and Treatments Labs (all labs ordered are listed, but only abnormal results are displayed) Labs Reviewed  BASIC METABOLIC PANEL - Abnormal; Notable for the following components:      Result Value   Sodium 134 (*)    Potassium 3.2 (*)    CO2 19 (*)    All other components within normal limits  CBC WITH DIFFERENTIAL/PLATELET - Abnormal; Notable for the following components:   WBC 13.4 (*)    Hemoglobin 10.1 (*)    HCT 29.5 (*)    MCV 68.6 (*)    MCH 23.5 (*)    RDW 17.9 (*)    Neutro Abs 11.7 (*)    All other components within normal limits  URINALYSIS, ROUTINE W REFLEX MICROSCOPIC - Abnormal; Notable for the following components:   Color, Urine AMBER (*)    APPearance CLOUDY (*)    Specific Gravity, Urine 1.031 (*)    Hgb urine dipstick MODERATE (*)    Ketones, ur 80 (*)    Protein, ur 30 (*)    Leukocytes,Ua MODERATE (*)    All other components within normal limits  RESP PANEL BY RT-PCR (FLU A&B, COVID) ARPGX2  GROUP A STREP BY PCR  I-STAT BETA HCG BLOOD, ED ([facility], [facility], [facility] ONLY)  EKG  EKG Interpretation  Date/Time:    Ventricular Rate:    PR Interval:    QRS Duration:   QT Interval:    QTC Calculation:   R Axis:     Text Interpretation:         Radiology DG Chest 2 View  Result Date: 07/24/2021 CLINICAL DATA:  Fever.  Body aches. EXAM: CHEST - 2 VIEW COMPARISON:  None. FINDINGS: The heart and mediastinal contours are within  normal limits. No focal consolidation. No pulmonary edema. No pleural effusion. No pneumothorax. No acute osseous abnormality. IMPRESSION: No active cardiopulmonary disease. Electronically Signed   By: Tish Frederickson M.D.   On: 07/24/2021 20:53    Pertinent labs & imaging results that were available during my care of the patient were reviewed by me and considered in my medical decision making (see MDM for details).  Medications Ordered in ED Medications  acetaminophen (TYLENOL) tablet 1,000 mg (1,000 mg Oral Given 07/24/21 2022)  ibuprofen (ADVIL) tablet 600 mg (600 mg Oral Given 07/25/21 0456)  cephALEXin (KEFLEX) capsule 500 mg (500 mg Oral Given 07/25/21 0730)                                                                                                                                     Procedures Procedures  (including critical care time)  Medical Decision Making / ED Course I have reviewed the nursing notes for this encounter and the patient's prior records (if available in EHR or on provided paperwork).  Kelly Arellano was evaluated in Emergency Department on 07/25/2021 for the symptoms described in the history of present illness. She was evaluated in the context of the global COVID-19 pandemic, which necessitated consideration that the patient might be at risk for infection with the SARS-CoV-2 virus that causes COVID-19. Institutional protocols and algorithms that pertain to the evaluation of patients at risk for COVID-19 are in a state of rapid change based on information released by regulatory bodies including the CDC and federal and state organizations. These policies and algorithms were followed during the patient's care in the ED.     Patient presents with 1 day of fever and sore throat.  Several weeks of myalgias.  Seen in MSE process and screening labs obtained.  Pertinent labs & imaging results that were available during my care of the patient were reviewed by me and considered  in my medical decision making:  CBC with leukocytosis. No significant electrolyte derangement or renal sufficiency. UA questionable for infection. COVID and influenza negative.  Rapid strep negative. Chest x-ray without pneumonia. Low suspicion for meningitis.  No evidence of acute otitis media.  Evidence of possible pharyngitis.  Patient's clinical symptoms are more likely consistent with viral process but given the questionable urine, will treat with short course of antibiotics.  Final Clinical Impression(s) / ED Diagnoses Final diagnoses:  Fever in adult   The patient appears reasonably screened  and/or stabilized for discharge and I doubt any other medical condition or other [facility] requiring further screening, evaluation, or treatment in the ED at this time prior to discharge. Safe for discharge with strict return precautions.  Disposition: Discharge  Condition: Good  I have discussed the results, Dx and Tx plan with the patient/family who expressed understanding and agree(s) with the plan. Discharge instructions discussed at length. The patient/family was given strict return precautions who verbalized understanding of the instructions. No further questions at time of discharge.    ED Discharge Orders          Ordered    cephALEXin (KEFLEX) 500 MG capsule  3 times daily        07/25/21 0816            Follow Up: Primary care provider  Call  to schedule an appointment for close follow up     This chart was dictated using voice recognition software.  Despite best efforts to proofread,  errors can occur which can change the documentation meaning.      Nira Conn, MD 07/25/21 2001

## 2022-02-25 IMAGING — US US PELVIS COMPLETE WITH TRANSVAGINAL
1 series · 15 of 25 positions shown · non-contrast
Comparison: None

CLINICAL DATA: Patient reports positive pregnancy test.  Nausea.



[Series 1: us ob comp less 14 wks mc & wl · 15 of 55 slices shown]
[im 1/55]
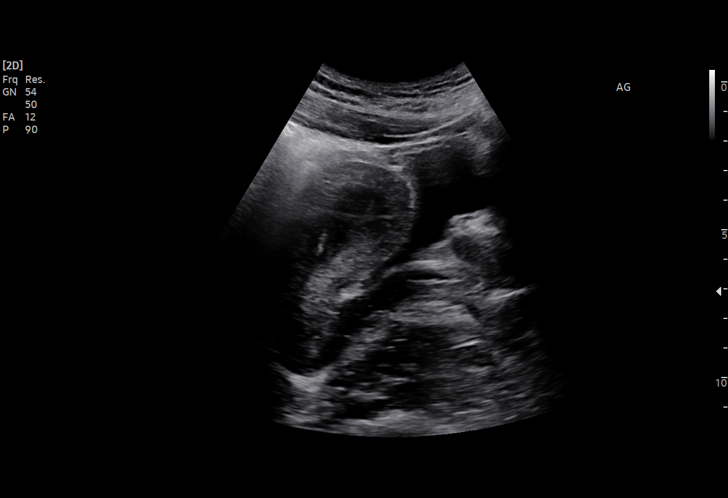
[im 5/55]
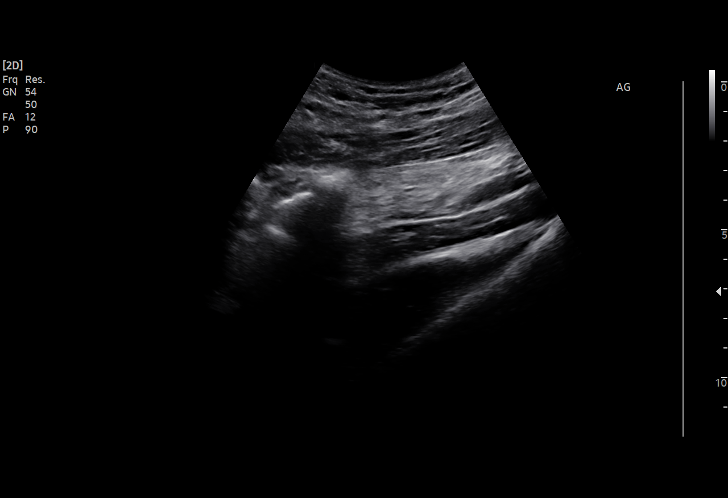
[im 10/55]
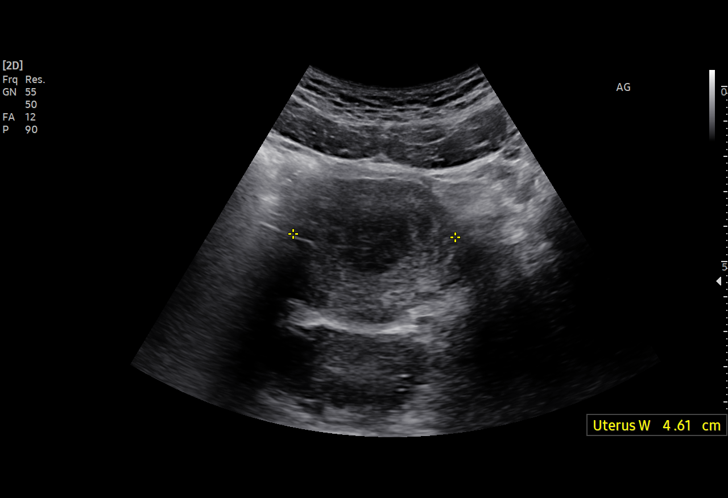
[im 12/55]
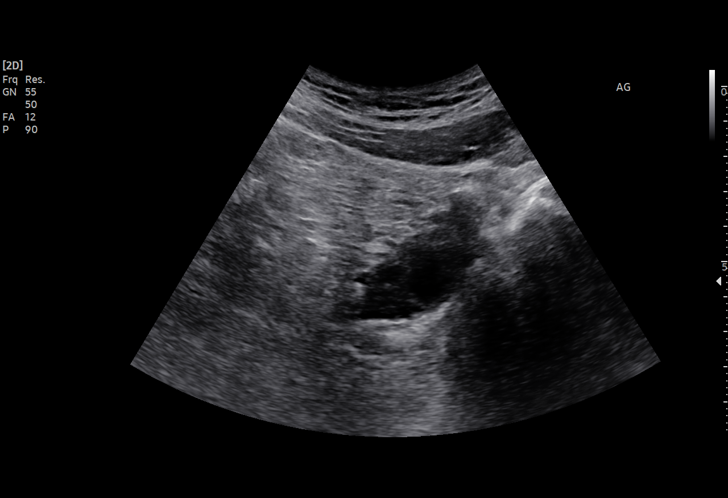
[im 16/55]
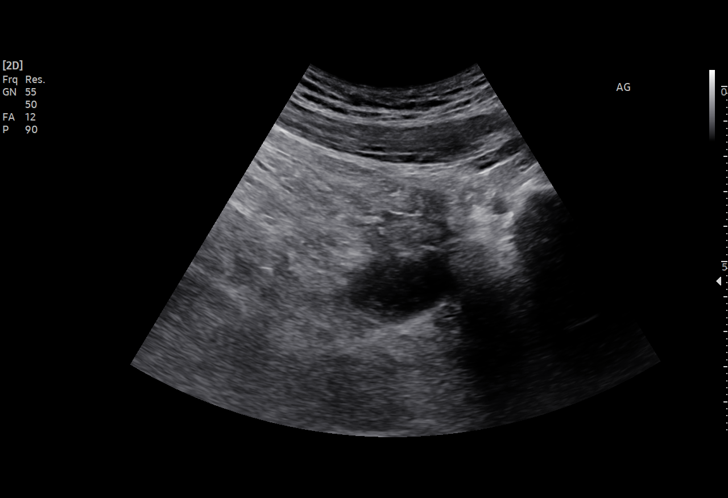
[im 21/55]
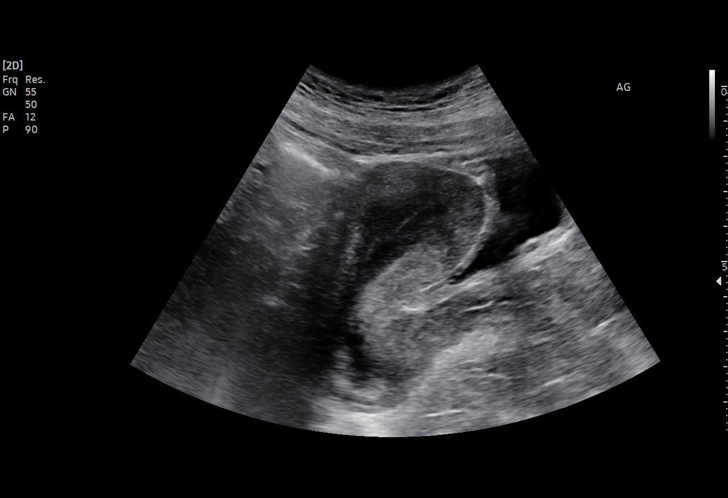
[im 23/55]
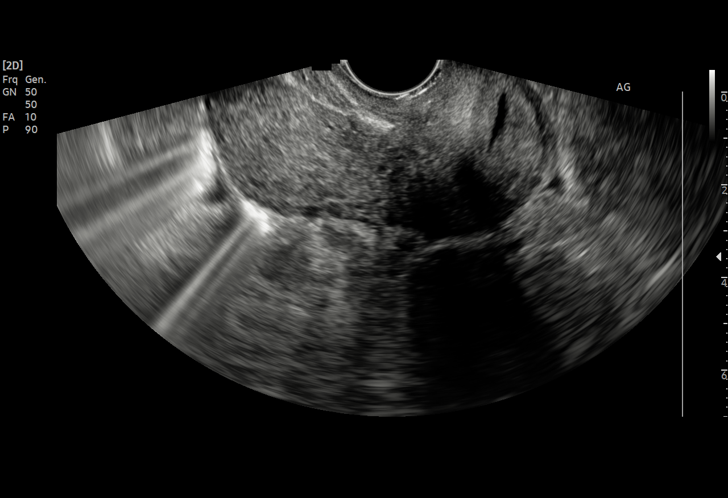
[im 28/55]
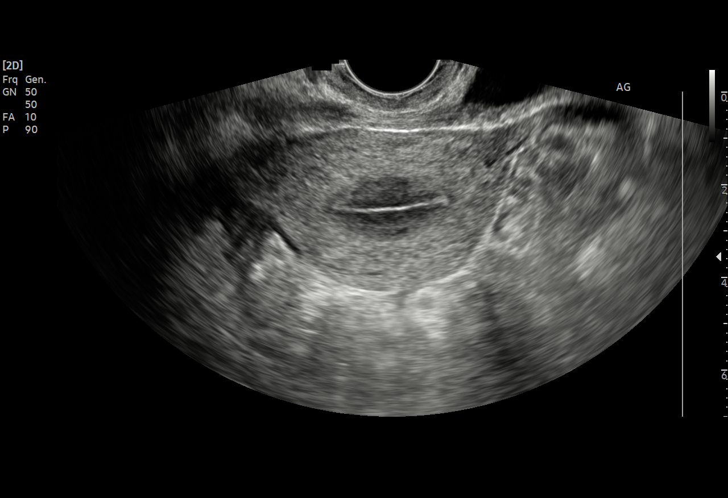
[im 32/55]
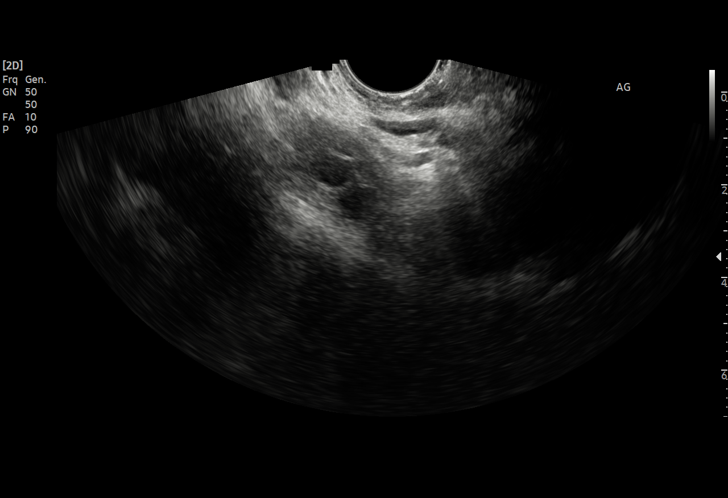
[im 34/55]
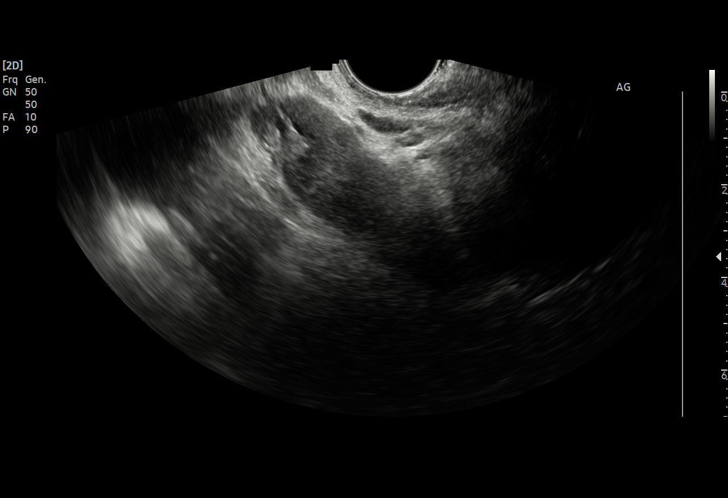
[im 39/55]
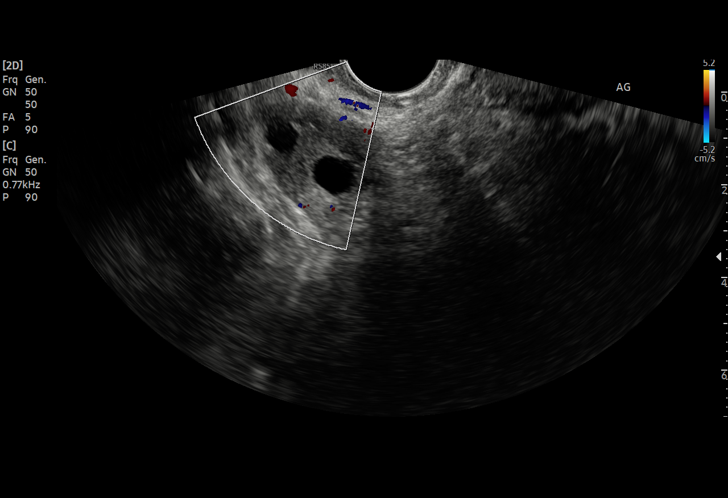
[im 43/55]
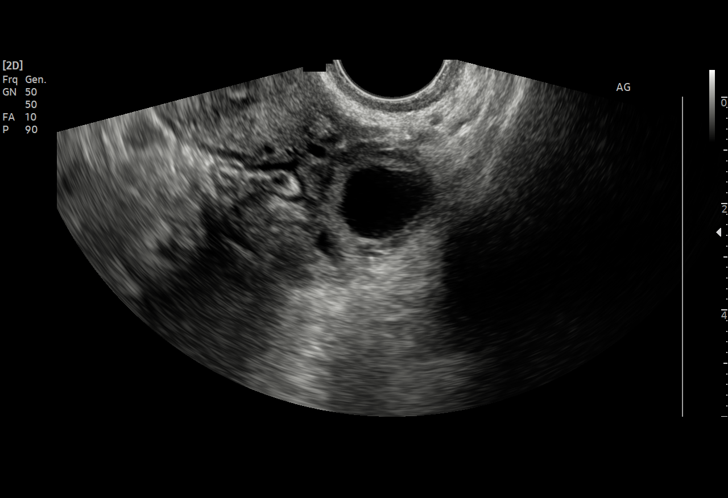
[im 46/55]
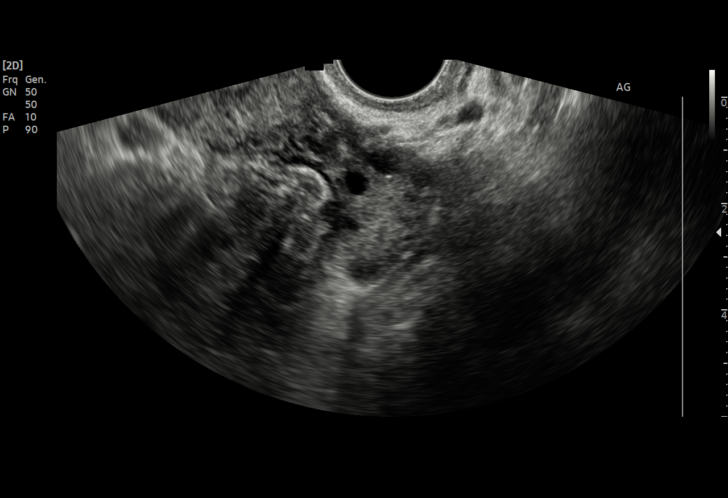
[im 50/55]
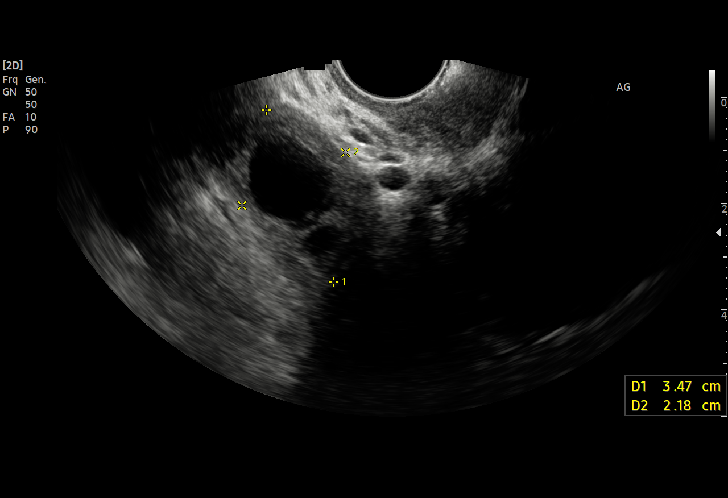
[im 55/55]
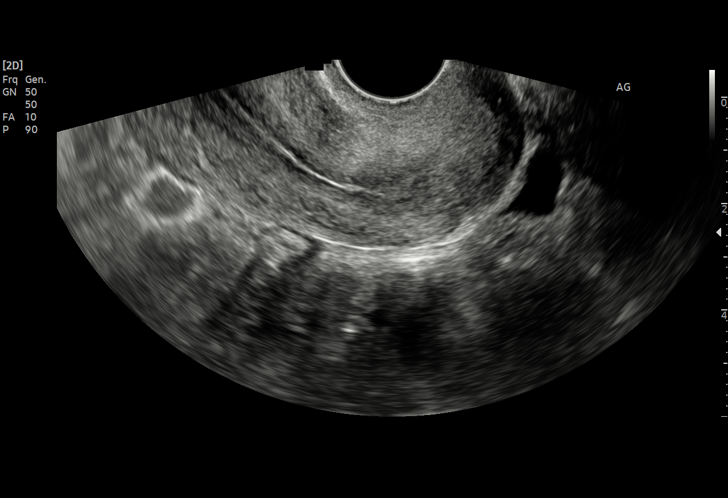

[15 of 25 positions shown; findings below may reference images not displayed]

FINDINGS: Uterus

Measurements: 7.9 x 4.0 x 5.1 cm = volume: 85 mL. No fibroids or
other mass visualized.

Endometrium

Thickness: 7.  No focal abnormality visualized.

Right ovary

Measurements: 2.7 x 2.1 x 2.3 cm = volume: 7 mL. Normal
appearance/no adnexal mass.

Left ovary

Measurements: 3.5 x 2.2 x 2.6 cm = volume: 10 mL. Normal
appearance/no adnexal mass.

Other findings

Small amount of free fluid in the pelvis.
IMPRESSION: No acute process within the pelvis.

No intrauterine gestation identified. If the patient is pregnant
based upon laboratory analysis, this represents pregnancy of unknown
location. In the setting of positive pregnancy test and no definite
intrauterine pregnancy, this reflects a pregnancy of unknown
location. Differential considerations include early normal IUP,
abnormal IUP, or nonvisualized ectopic pregnancy. Differentiation is
achieved with serial beta HCG supplemented by repeat sonography as
clinically warranted.

## 2022-07-22 ENCOUNTER — Emergency Department (HOSPITAL_COMMUNITY)
Admission: EM | Admit: 2022-07-22 | Discharge: 2022-07-22 | Disposition: A | Discharge status: Home / Self Care | Payer: Medicaid Other | Attending: Emergency Medicine | Admitting: Emergency Medicine

## 2022-07-22 ENCOUNTER — Emergency Department (HOSPITAL_COMMUNITY): Payer: Medicaid Other

## 2022-07-22 ENCOUNTER — Encounter (HOSPITAL_COMMUNITY): Payer: Self-pay | Admitting: Emergency Medicine

## 2022-07-22 DIAGNOSIS — R0981 Nasal congestion: Secondary | ICD-10-CM | POA: Diagnosis not present

## 2022-07-22 DIAGNOSIS — R197 Diarrhea, unspecified: Secondary | ICD-10-CM | POA: Insufficient documentation

## 2022-07-22 DIAGNOSIS — Z20822 Contact with and (suspected) exposure to covid-19: Secondary | ICD-10-CM | POA: Diagnosis not present

## 2022-07-22 DIAGNOSIS — Z79899 Other long term (current) drug therapy: Secondary | ICD-10-CM | POA: Insufficient documentation

## 2022-07-22 DIAGNOSIS — R0602 Shortness of breath: Secondary | ICD-10-CM | POA: Insufficient documentation

## 2022-07-22 DIAGNOSIS — I10 Essential (primary) hypertension: Secondary | ICD-10-CM | POA: Diagnosis not present

## 2022-07-22 DIAGNOSIS — R112 Nausea with vomiting, unspecified: Secondary | ICD-10-CM | POA: Diagnosis present

## 2022-07-22 LAB — COMPREHENSIVE METABOLIC PANEL
ALT: 10 U/L (ref 0–44)
AST: 15 U/L (ref 15–41)
Albumin: 4.4 g/dL (ref 3.5–5.0)
Alkaline Phosphatase: 47 U/L (ref 38–126)
Anion gap: 11 (ref 5–15)
BUN: 5 mg/dL — ABNORMAL LOW (ref 6–20)
CO2: 21 mmol/L — ABNORMAL LOW (ref 22–32)
Calcium: 8.9 mg/dL (ref 8.9–10.3)
Chloride: 106 mmol/L (ref 98–111)
Creatinine, Ser: 0.69 mg/dL (ref 0.44–1.00)
GFR, Estimated: >60 mL/min (ref >60)
Glucose, Bld: 166 mg/dL — ABNORMAL HIGH (ref 70–99)
Potassium: 3.2 mmol/L — ABNORMAL LOW (ref 3.5–5.1)
Sodium: 138 mmol/L (ref 135–145)
Total Bilirubin: 0.6 mg/dL (ref 0.3–1.2)
Total Protein: 8 g/dL (ref 6.5–8.1)

## 2022-07-22 LAB — URINALYSIS, ROUTINE W REFLEX MICROSCOPIC
Bilirubin Urine: NEGATIVE
Glucose, UA: 50 mg/dL — AB
Ketones, ur: 5 mg/dL — AB
Nitrite: NEGATIVE
Protein, ur: 30 mg/dL — AB
Specific Gravity, Urine: 1.016 (ref 1.005–1.030)
pH: 7 (ref 5.0–8.0)

## 2022-07-22 LAB — CBC WITH DIFFERENTIAL/PLATELET
Abs Immature Granulocytes: 0.05 10*3/uL (ref 0.00–0.07)
Basophils Absolute: 0 10*3/uL (ref 0.0–0.1)
Basophils Relative: 0 %
Eosinophils Absolute: 0 10*3/uL (ref 0.0–0.5)
Eosinophils Relative: 0 %
HCT: 34 % — ABNORMAL LOW (ref 36.0–46.0)
Hemoglobin: 12.1 g/dL (ref 12.0–15.0)
Immature Granulocytes: 1 %
Lymphocytes Relative: 10 %
Lymphs Abs: 1 10*3/uL (ref 0.7–4.0)
MCH: 26.7 pg (ref 26.0–34.0)
MCHC: 35.6 g/dL (ref 30.0–36.0)
MCV: 75.1 fL — ABNORMAL LOW (ref 80.0–100.0)
Monocytes Absolute: 0.5 10*3/uL (ref 0.1–1.0)
Monocytes Relative: 5 %
Neutro Abs: 8.9 10*3/uL — ABNORMAL HIGH (ref 1.7–7.7)
Neutrophils Relative %: 84 %
Platelets: 292 10*3/uL (ref 150–400)
RBC: 4.53 MIL/uL (ref 3.87–5.11)
RDW: 16.3 % — ABNORMAL HIGH (ref 11.5–15.5)
WBC: 10.5 10*3/uL (ref 4.0–10.5)
nRBC: 0 % (ref 0.0–0.2)

## 2022-07-22 LAB — RAPID URINE DRUG SCREEN, HOSP PERFORMED
Amphetamines: NOT DETECTED
Barbiturates: NOT DETECTED
Benzodiazepines: NOT DETECTED
Cocaine: NOT DETECTED
Opiates: NOT DETECTED
Tetrahydrocannabinol: POSITIVE — AB

## 2022-07-22 LAB — RESP PANEL BY RT-PCR (FLU A&B, COVID) ARPGX2
Influenza A by PCR: NEGATIVE
Influenza B by PCR: NEGATIVE
SARS Coronavirus 2 by RT PCR: NEGATIVE

## 2022-07-22 LAB — LIPASE, BLOOD: Lipase: 32 U/L (ref 11–51)

## 2022-07-22 LAB — PREGNANCY, URINE: Preg Test, Ur: NEGATIVE

## 2022-07-22 MED ORDER — LACTATED RINGERS IV BOLUS
1000.0000 mL | Freq: Once | INTRAVENOUS | Status: AC
  Administered 2022-07-22: 1000 mL via INTRAVENOUS

## 2022-07-22 MED ORDER — ONDANSETRON HCL 4 MG/2ML IJ SOLN
4.0000 mg | Freq: Once | INTRAMUSCULAR | Status: AC
  Administered 2022-07-22: 4 mg via INTRAVENOUS
  Filled 2022-07-22: qty 2

## 2022-07-22 MED ORDER — IOHEXOL 350 MG/ML SOLN
100.0000 mL | Freq: Once | INTRAVENOUS | Status: AC | PRN
  Administered 2022-07-22: 100 mL via INTRAVENOUS

## 2022-07-22 MED ORDER — METOCLOPRAMIDE HCL 5 MG/ML IJ SOLN
10.0000 mg | Freq: Once | INTRAMUSCULAR | Status: AC
  Administered 2022-07-22: 10 mg via INTRAVENOUS
  Filled 2022-07-22: qty 2

## 2022-07-22 MED ORDER — ONDANSETRON 4 MG PO TBDP: ORAL_TABLET | ORAL | 0 refills | Status: AC

## 2022-07-22 NOTE — ED Notes (Signed)
Pt to xr 

## 2022-07-22 NOTE — ED Triage Notes (Signed)
Pt arrives POV c/o nausea, vomiting, chills, cough, shortness of breath, and fatigue.

## 2022-07-22 NOTE — ED Provider Notes (Signed)
[facility] EMERGENCY DEPARTMENT Provider Note   CSN: [id number] Arrival date & time: 07/22/22  0601     History  Chief Complaint  Patient presents with   Multiple Complaints    Kelly Arellano is a 21 y.o. female.  Patient presents to the emergency department with multiple complaints.  Patient reports that she woke up feeling poorly.  Patient reports that she developed nausea and has vomited multiple times.  She has had some diarrhea as well.  Patient feels weak and has been sick for the last few days with cough and congestion.  Patient reports that she vomited multiple times, then started dry heaving.  After she began dry heaving she felt short of breath and this has been continuous.       Home Medications Prior to Admission medications   Medication Sig Start Date End Date Taking? Authorizing Provider  hydrochlorothiazide (HYDRODIURIL) 50 MG tablet Take 50 mg by mouth daily. 03/31/21   [provider]  mometasone (NASONEX) 50 MCG/ACT nasal spray Place 2 sprays into the nose daily as needed (allergies). 01/22/19   [provider]  montelukast (SINGULAIR) 10 MG tablet Take 10 mg by mouth daily.    [provider]  norethindrone-ethinyl estradiol-FE (LOESTRIN FE) 1-20 MG-MCG tablet Take 1 tablet by mouth daily. 05/06/21   [provider]  ondansetron (ZOFRAN ODT) 4 MG disintegrating tablet Take 1 tablet (4 mg total) by mouth every 8 (eight) hours as needed for nausea or vomiting. Patient not taking: Reported on 07/25/2021 04/25/21   Namon Cirri E, PA-C  potassium chloride SA (KLOR-CON) 20 MEQ tablet Take 1 tablet (20 mEq total) by mouth 2 (two) times daily for 5 days. Patient not taking: Reported on 07/25/2021 01/08/21 01/13/21  McDonald, Pedro Earls A, PA-C  Prenatal Vit-Fe Fumarate-FA (PRENATAL VITAMINS) 28-0.8 MG TABS Take 1 tablet by mouth daily. Patient not taking: Reported on 07/25/2021 04/21/21   Domenick Gong, MD      Allergies    Molds & smuts  and Pollen extract    Review of Systems   Review of Systems  Physical Exam Updated Vital Signs BP (!) 168/102 (BP Location: Left Arm)   Pulse 73   Temp 98.1 F (36.7 C) (Oral)   Resp 18   Ht 5\' 4"  (1.626 m)   Wt 64.9 kg   LMP 07/21/2022 (Approximate)   SpO2 100%   BMI 24.56 kg/m  Physical Exam Vitals and nursing note reviewed.  Constitutional:      General: She is not in acute distress.    Appearance: She is well-developed. She is ill-appearing and diaphoretic.  HENT:     Head: Normocephalic and atraumatic.     Mouth/Throat:     Mouth: Mucous membranes are moist.  Eyes:     General: Vision grossly intact. Gaze aligned appropriately.     Extraocular Movements: Extraocular movements intact.     Conjunctiva/sclera: Conjunctivae normal.  Cardiovascular:     Rate and Kelly: Normal rate and regular Kelly.     Pulses: Normal pulses.     Heart sounds: Normal heart sounds, S1 normal and S2 normal. No murmur heard.    No friction rub. No gallop.  Pulmonary:     Effort: Pulmonary effort is normal. No respiratory distress.     Breath sounds: Normal breath sounds.  Abdominal:     General: Bowel sounds are normal.     Palpations: Abdomen is soft.     Tenderness: There is no abdominal tenderness. There is  no guarding or rebound.     Hernia: No hernia is present.  Musculoskeletal:        General: No swelling.     Cervical back: Full passive range of motion without pain, normal range of motion and neck supple. No spinous process tenderness or muscular tenderness. Normal range of motion.     Right lower leg: No edema.     Left lower leg: No edema.  Skin:    General: Skin is warm.     Capillary Refill: Capillary refill takes less than 2 seconds.     Findings: No ecchymosis, erythema, rash or wound.  Neurological:     General: No focal deficit present.     Mental Status: She is alert and oriented to person, place, and time.     GCS: GCS eye subscore is 4. GCS verbal subscore is  5. GCS motor subscore is 6.     Cranial Nerves: Cranial nerves 2-12 are intact.     Sensory: Sensation is intact.     Motor: Motor function is intact.     Coordination: Coordination is intact.  Psychiatric:        Attention and Perception: Attention normal.        Mood and Affect: Mood normal.        Speech: Speech normal.        Behavior: Behavior normal.     ED Results / Procedures / Treatments   Labs (all labs ordered are listed, but only abnormal results are displayed) Labs Reviewed  CBC WITH DIFFERENTIAL/PLATELET - Abnormal; Notable for the following components:      Result Value   HCT 34.0 (*)    MCV 75.1 (*)    RDW 16.3 (*)    Neutro Abs 8.9 (*)    All other components within normal limits  COMPREHENSIVE METABOLIC PANEL - Abnormal; Notable for the following components:   Potassium 3.2 (*)    CO2 21 (*)    Glucose, Bld 166 (*)    BUN 5 (*)    All other components within normal limits  RAPID URINE DRUG SCREEN, HOSP PERFORMED - Abnormal; Notable for the following components:   Tetrahydrocannabinol POSITIVE (*)    All other components within normal limits  RESP PANEL BY RT-PCR (FLU A&B, COVID) ARPGX2  LIPASE, BLOOD  PREGNANCY, URINE  URINALYSIS, ROUTINE W REFLEX MICROSCOPIC    EKG EKG Interpretation  Date/Time:  Thursday July 22 2022 06:48:46 EDT Ventricular Rate:  75 PR Interval:  174 QRS Duration: 90 QT Interval:  469 QTC Calculation: 524 R Axis:   73 Text Interpretation: Sinus Kelly Consider left atrial enlargement LVH by voltage Nonspecific T abnormalities, lateral leads Prolonged QT interval Confirmed by Gilda Crease [phone]) on 07/22/2022 6:50:54 AM  Radiology No results found.  Procedures Procedures    Medications Ordered in ED Medications  lactated ringers bolus 1,000 mL (1,000 mLs Intravenous New Bag/Given 07/22/22 0639)  ondansetron (ZOFRAN) injection 4 mg (4 mg Intravenous Given 07/22/22 0639)  metoCLOPramide (REGLAN) injection  10 mg (10 mg Intravenous Given 07/22/22 6269)    ED Course/ Medical Decision Making/ A&P                           Medical Decision Making Amount and/or Complexity of Data Reviewed Labs: ordered. Radiology: ordered.  Risk Prescription drug management.   Patient presents to the emergency department with flulike illness.  Patient awakened with nausea, vomiting and diarrhea.  She has been experiencing cough, chest congestion as well.  Patient now feeling short of breath in addition to initial symptoms.  She is oxygenating well.  No fever.  Patient noted to be hypertensive at arrival, does have a history of hypertension, prior prescription for hydrochlorothiazide.  Patient administered IV fluids, Zofran, Reglan.  Nausea is improving.  Repeat examination reveals no abdominal tenderness.  She is, however, still complaining of shortness of breath.  Chest x-ray does not show any pneumonia or other cause for shortness of breath.  Will perform CT angiography to rule out PE.  Will sign out to oncoming ER physician to follow-up results.        Final Clinical Impression(s) / ED Diagnoses Final diagnoses:  Nausea vomiting and diarrhea    Rx / DC Orders ED Discharge Orders     None         Gilda Crease, MD 07/22/22 [phone]

## 2022-07-22 NOTE — Discharge Instructions (Signed)
Drink plenty of fluids.  Follow-up with your doctor or family doctor if not improving

## 2022-07-23 ENCOUNTER — Emergency Department (HOSPITAL_COMMUNITY)
Admission: EM | Admit: 2022-07-23 | Discharge: 2022-07-23 | Discharge status: Against Medical Advice | Payer: Medicaid Other | Source: Home / Self Care

## 2022-07-23 DIAGNOSIS — R0602 Shortness of breath: Secondary | ICD-10-CM | POA: Insufficient documentation

## 2022-07-23 DIAGNOSIS — F17210 Nicotine dependence, cigarettes, uncomplicated: Secondary | ICD-10-CM | POA: Diagnosis not present

## 2022-07-23 DIAGNOSIS — R112 Nausea with vomiting, unspecified: Secondary | ICD-10-CM | POA: Diagnosis not present

## 2022-07-23 DIAGNOSIS — Z79899 Other long term (current) drug therapy: Secondary | ICD-10-CM | POA: Diagnosis not present

## 2022-07-23 DIAGNOSIS — I10 Essential (primary) hypertension: Secondary | ICD-10-CM | POA: Diagnosis not present

## 2022-07-24 ENCOUNTER — Encounter (HOSPITAL_COMMUNITY): Payer: Self-pay | Admitting: Emergency Medicine

## 2022-07-24 ENCOUNTER — Other Ambulatory Visit: Payer: Self-pay

## 2022-07-24 ENCOUNTER — Emergency Department (HOSPITAL_COMMUNITY): Payer: Medicaid Other

## 2022-07-24 ENCOUNTER — Emergency Department (HOSPITAL_COMMUNITY)
Admission: EM | Admit: 2022-07-24 | Discharge: 2022-07-24 | Disposition: A | Discharge status: Home / Self Care | Payer: Medicaid Other | Attending: Student | Admitting: Student

## 2022-07-24 DIAGNOSIS — R112 Nausea with vomiting, unspecified: Secondary | ICD-10-CM

## 2022-07-24 LAB — COMPREHENSIVE METABOLIC PANEL
ALT: 13 U/L (ref 0–44)
AST: 17 U/L (ref 15–41)
Albumin: 4.9 g/dL (ref 3.5–5.0)
Alkaline Phosphatase: 46 U/L (ref 38–126)
Anion gap: 14 (ref 5–15)
BUN: 13 mg/dL (ref 6–20)
CO2: 23 mmol/L (ref 22–32)
Calcium: 9.5 mg/dL (ref 8.9–10.3)
Chloride: 99 mmol/L (ref 98–111)
Creatinine, Ser: 0.91 mg/dL (ref 0.44–1.00)
GFR, Estimated: >60 mL/min (ref >60)
Glucose, Bld: 107 mg/dL — ABNORMAL HIGH (ref 70–99)
Potassium: 2.9 mmol/L — ABNORMAL LOW (ref 3.5–5.1)
Sodium: 136 mmol/L (ref 135–145)
Total Bilirubin: 0.9 mg/dL (ref 0.3–1.2)
Total Protein: 8.6 g/dL — ABNORMAL HIGH (ref 6.5–8.1)

## 2022-07-24 LAB — CBC
HCT: 37.1 % (ref 36.0–46.0)
Hemoglobin: 13.3 g/dL (ref 12.0–15.0)
MCH: 26.1 pg (ref 26.0–34.0)
MCHC: 35.8 g/dL (ref 30.0–36.0)
MCV: 72.7 fL — ABNORMAL LOW (ref 80.0–100.0)
Platelets: 341 10*3/uL (ref 150–400)
RBC: 5.1 MIL/uL (ref 3.87–5.11)
RDW: 15.9 % — ABNORMAL HIGH (ref 11.5–15.5)
WBC: 8.8 10*3/uL (ref 4.0–10.5)
nRBC: 0 % (ref 0.0–0.2)

## 2022-07-24 LAB — URINALYSIS, ROUTINE W REFLEX MICROSCOPIC
Bilirubin Urine: NEGATIVE
Glucose, UA: NEGATIVE mg/dL
Ketones, ur: 20 mg/dL — AB
Nitrite: NEGATIVE
Protein, ur: >=300 mg/dL — AB
Specific Gravity, Urine: 1.029 (ref 1.005–1.030)
WBC, UA: >50 WBC/hpf — ABNORMAL HIGH (ref 0–5)
pH: 5 (ref 5.0–8.0)

## 2022-07-24 LAB — LIPASE, BLOOD: Lipase: 27 U/L (ref 11–51)

## 2022-07-24 MED ORDER — LACTATED RINGERS IV BOLUS
1000.0000 mL | Freq: Once | INTRAVENOUS | Status: AC
  Administered 2022-07-24: 1000 mL via INTRAVENOUS

## 2022-07-24 MED ORDER — DROPERIDOL 2.5 MG/ML IJ SOLN
1.2500 mg | Freq: Once | INTRAMUSCULAR | Status: AC
  Administered 2022-07-24: 1.25 mg via INTRAVENOUS
  Filled 2022-07-24: qty 2

## 2022-07-24 MED ORDER — PROMETHAZINE HCL 25 MG RE SUPP: 25.0000 mg | Freq: Four times a day (QID) | RECTAL | 0 refills | Status: AC | PRN

## 2022-07-24 MED ORDER — LORAZEPAM 2 MG/ML IJ SOLN
0.5000 mg | Freq: Once | INTRAMUSCULAR | Status: AC
  Administered 2022-07-24: 0.5 mg via INTRAVENOUS
  Filled 2022-07-24: qty 1

## 2022-07-24 MED ORDER — POTASSIUM CHLORIDE 10 MEQ/100ML IV SOLN
10.0000 meq | INTRAVENOUS | Status: AC
  Administered 2022-07-24 (×2): 10 meq via INTRAVENOUS
  Filled 2022-07-24 (×2): qty 100

## 2022-07-24 NOTE — ED Provider Notes (Signed)
[facility] EMERGENCY DEPARTMENT Provider Note  CSN: [id number] Arrival date & time: 07/23/22 2327  Chief Complaint(s) Emesis  HPI Kelly Arellano is a 21 y.o. female who presents emergency department for evaluation of nausea, vomiting, shortness of breath.  Patient states that symptoms have been present for the last 5 days and she was seen in the emergency department yesterday with an extensive work-up including CT PE and CT abdomen pelvis that was reassuringly negative.  She was ultimately discharged home with Zofran but continues to vomit through the Zofran.  She states that up into the beginning of her symptoms 5 days ago she was smoking marijuana daily but has not smoked since.  She states that she has been having difficulty tolerating p.o. secondary to the frequent vomiting and feels that she vomits approximately every 2 hours.  No hematemesis or diarrhea.  Denies chest pain, abdominal pain, headache, fever or other systemic symptoms.   Past Medical History Past Medical History:  Diagnosis Date   Hypertension    There are no problems to display for this patient.  Home Medication(s) Prior to Admission medications   Medication Sig Start Date End Date Taking? Authorizing Provider  hydrochlorothiazide (HYDRODIURIL) 50 MG tablet Take 50 mg by mouth daily. 03/31/21   [provider]  mometasone (NASONEX) 50 MCG/ACT nasal spray Place 2 sprays into the nose daily as needed (allergies). 01/22/19   [provider]  montelukast (SINGULAIR) 10 MG tablet Take 10 mg by mouth daily.    [provider]  norethindrone-ethinyl estradiol-FE (LOESTRIN FE) 1-20 MG-MCG tablet Take 1 tablet by mouth daily. 05/06/21   [provider]  ondansetron (ZOFRAN-ODT) 4 MG disintegrating tablet 4mg  ODT q4 hours prn nausea/vomit 07/22/22   Milton Ferguson, MD  potassium chloride SA (KLOR-CON) 20 MEQ tablet Take 1 tablet (20 mEq total) by mouth 2 (two) times daily for 5 days. Patient not  taking: Reported on 07/25/2021 01/08/21 01/13/21  McDonald, Maree Erie A, PA-C  Prenatal Vit-Fe Fumarate-FA (PRENATAL VITAMINS) 28-0.8 MG TABS Take 1 tablet by mouth daily. Patient not taking: Reported on 07/25/2021 04/21/21   Melynda Ripple, MD                                                                                                                                    Past Surgical History History reviewed. No pertinent surgical history. Family History History reviewed. No pertinent family history.  Social History Social History   Tobacco Use   Smoking status: Every Day    Types: Cigarettes   Smokeless tobacco: Never  Substance Use Topics   Alcohol use: Never   Drug use: Yes    Types: Marijuana    Comment: daily   Allergies Molds & smuts and Pollen extract  Review of Systems Review of Systems  Gastrointestinal:  Positive for nausea and vomiting.    Physical Exam Vital Signs  I have reviewed the triage vital signs  BP (!) 161/105 (BP Location: Right Arm)   Pulse 96   Temp 98.3 F (36.8 C) (Oral)   Resp 18   Ht 5\' 4"  (1.626 m)   Wt 65 kg   LMP 07/21/2022 (Approximate)   SpO2 97%   BMI 24.60 kg/m   Physical Exam Vitals and nursing note reviewed.  Constitutional:      General: She is not in acute distress.    Appearance: She is well-developed.  HENT:     Head: Normocephalic and atraumatic.  Eyes:     Conjunctiva/sclera: Conjunctivae normal.  Cardiovascular:     Rate and Rhythm: Normal rate and regular rhythm.     Heart sounds: No murmur heard. Pulmonary:     Effort: Pulmonary effort is normal. No respiratory distress.     Breath sounds: Normal breath sounds.  Abdominal:     Palpations: Abdomen is soft.     Tenderness: There is no abdominal tenderness.  Musculoskeletal:        General: No swelling.     Cervical back: Neck supple.  Skin:    General: Skin is warm and dry.     Capillary Refill: Capillary refill takes less than 2 seconds.  Neurological:      Mental Status: She is alert.  Psychiatric:        Mood and Affect: Mood normal.     ED Results and Treatments Labs (all labs ordered are listed, but only abnormal results are displayed) Labs Reviewed  COMPREHENSIVE METABOLIC PANEL - Abnormal; Notable for the following components:      Result Value   Potassium 2.9 (*)    Glucose, Bld 107 (*)    Total Protein 8.6 (*)    All other components within normal limits  CBC - Abnormal; Notable for the following components:   MCV 72.7 (*)    RDW 15.9 (*)    All other components within normal limits  URINALYSIS, ROUTINE W REFLEX MICROSCOPIC - Abnormal; Notable for the following components:   Color, Urine AMBER (*)    APPearance CLOUDY (*)    Hgb urine dipstick LARGE (*)    Ketones, ur 20 (*)    Protein, ur >=300 (*)    Leukocytes,Ua SMALL (*)    WBC, UA >50 (*)    Bacteria, UA RARE (*)    Non Squamous Epithelial 0-5 (*)    All other components within normal limits  URINE CULTURE  LIPASE, BLOOD                                                                                                                          Radiology DG Chest Portable 1 View  Result Date: 07/24/2022 CLINICAL DATA:  SOB. Pt seen yesterday for N/V and returns tonight for same as well as HTN. Pt states last time her BP was checked was yesterday. Pt states she was given Zofran for nausea but that it is not helping. Pt vomited once in waiting room EXAM:  PORTABLE CHEST 1 VIEW COMPARISON:  Chest x-ray 07/22/2022, CT angio chest 07/22/2022 FINDINGS: The heart and mediastinal contours are within normal limits. No focal consolidation. No pulmonary edema. No pleural effusion. No pneumothorax. No acute osseous abnormality. IMPRESSION: No active disease. Electronically Signed   By: Iven Finn M.D.   On: 07/24/2022 00:44    Pertinent labs & imaging results that were available during my care of the patient were reviewed by me and considered in my medical decision making (see  MDM for details).  Medications Ordered in ED Medications  potassium chloride 10 mEq in 100 mL IVPB (10 mEq Intravenous New Bag/Given 07/24/22 0204)  droperidol (INAPSINE) 2.5 MG/ML injection 1.25 mg (1.25 mg Intravenous Given 07/24/22 0152)  LORazepam (ATIVAN) injection 0.5 mg (0.5 mg Intravenous Given 07/24/22 0205)  lactated ringers bolus 1,000 mL (1,000 mLs Intravenous New Bag/Given 07/24/22 0207)                                                                                                                                     Procedures Procedures  (including critical care time)  Medical Decision Making / ED Course   This patient presents to the ED for concern of nausea, vomiting, this involves an extensive number of treatment options, and is a complaint that carries with it a high risk of complications and morbidity.  The differential diagnosis includes gastroenteritis, cannabinoid hyperemesis, electrolyte abnormality, urinary infection, intra-abdominal infection  MDM: Patient seen emergency room for evaluation of persistent nausea and vomiting.  Physical exam unremarkable.  Laboratory evaluation with some hypokalemia to 2.9 which was repleted here in the emergency department.  No significant leukocytosis and lipase negative.  Urinalysis with small leuk esterase, greater than 50 white blood cells but 21-50 squamous epithelial cells and given that the patient is not having any dysuria, we will follow this up with a urine culture and if positive patient will be called with antibiotics.  Given persistent nausea and vomiting breaking through home Zofran as well as patient history of daily marijuana use up until symptom onset, we trialed droperidol and Ativan and on reevaluation, patient states her symptoms are significantly improved.  She is able to tolerate p.o. without difficulty without any persistent nausea or vomiting.  At this time, patient symptomatically improved and is safe for discharge  with outpatient follow-up.  She was discharged with prescriptions for Phenergan suppositories if home Zofran is not helping her.  She was given return precautions of which she voiced understanding.  Additional imaging not obtained since she had both CT PE and CT abdomen pelvis performed yesterday that was reassuringly negative.   Additional history obtained:  -External records from outside source obtained and reviewed including: Chart review including previous notes, labs, imaging, consultation notes   Lab Tests: -I ordered, reviewed, and interpreted labs.   The pertinent results include:   Labs Reviewed  COMPREHENSIVE METABOLIC PANEL - Abnormal; Notable for  the following components:      Result Value   Potassium 2.9 (*)    Glucose, Bld 107 (*)    Total Protein 8.6 (*)    All other components within normal limits  CBC - Abnormal; Notable for the following components:   MCV 72.7 (*)    RDW 15.9 (*)    All other components within normal limits  URINALYSIS, ROUTINE W REFLEX MICROSCOPIC - Abnormal; Notable for the following components:   Color, Urine AMBER (*)    APPearance CLOUDY (*)    Hgb urine dipstick LARGE (*)    Ketones, ur 20 (*)    Protein, ur >=300 (*)    Leukocytes,Ua SMALL (*)    WBC, UA >50 (*)    Bacteria, UA RARE (*)    Non Squamous Epithelial 0-5 (*)    All other components within normal limits  URINE CULTURE  LIPASE, BLOOD      Imaging Studies ordered: I ordered imaging studies including chest x-ray I independently visualized and interpreted imaging. I agree with the radiologist interpretation   Medicines ordered and prescription drug management: Meds ordered this encounter  Medications   droperidol (INAPSINE) 2.5 MG/ML injection 1.25 mg   LORazepam (ATIVAN) injection 0.5 mg   lactated ringers bolus 1,000 mL   potassium chloride 10 mEq in 100 mL IVPB    -I have reviewed the patients home medicines and have made adjustments as needed  Critical  interventions none    Cardiac Monitoring: The patient was maintained on a cardiac monitor.  I personally viewed and interpreted the cardiac monitored which showed an underlying rhythm of: NSR  Social Determinants of Health:  Factors impacting patients care include: none   Reevaluation: After the interventions noted above, I reevaluated the patient and found that they have :improved  Co morbidities that complicate the patient evaluation  Past Medical History:  Diagnosis Date   Hypertension       Dispostion: I considered admission for this patient, but she currently does not meet inpatient criteria for admission and she is safe for discharge with outpatient follow-up     Final Clinical Impression(s) / ED Diagnoses Final diagnoses:  None     @PCDICTATION @    Skie Vitrano, Debe Coder, MD 07/24/22 [phone]

## 2022-07-24 NOTE — ED Triage Notes (Addendum)
Pt seen yesterday for N/V and returns tonight for same as well as HTN. Pt states last time her BP was checked was yesterday. Pt states she was given Zofran for nausea but that it is not helping. Pt vomited once in waiting room. Pt also states she has SOB at rest (whenever she is just sitting or trying to relax) but that she walked 2 miles to get here and was fine but thinks it was due to the "air being cold".

## 2022-07-25 LAB — URINE CULTURE: Culture: <10000 — AB

## 2022-10-31 ENCOUNTER — Emergency Department (HOSPITAL_COMMUNITY)
Admission: EM | Admit: 2022-10-31 | Discharge: 2022-10-31 | Disposition: A | Discharge status: Home / Self Care | Payer: Medicaid Other | Attending: Emergency Medicine | Admitting: Emergency Medicine

## 2022-10-31 ENCOUNTER — Encounter (HOSPITAL_COMMUNITY): Payer: Self-pay

## 2022-10-31 ENCOUNTER — Other Ambulatory Visit: Payer: Self-pay

## 2022-10-31 DIAGNOSIS — R112 Nausea with vomiting, unspecified: Secondary | ICD-10-CM | POA: Insufficient documentation

## 2022-10-31 DIAGNOSIS — Z79899 Other long term (current) drug therapy: Secondary | ICD-10-CM | POA: Insufficient documentation

## 2022-10-31 DIAGNOSIS — E876 Hypokalemia: Secondary | ICD-10-CM | POA: Diagnosis not present

## 2022-10-31 DIAGNOSIS — I10 Essential (primary) hypertension: Secondary | ICD-10-CM | POA: Insufficient documentation

## 2022-10-31 LAB — URINALYSIS, ROUTINE W REFLEX MICROSCOPIC
Bacteria, UA: NONE SEEN
Bilirubin Urine: NEGATIVE
Glucose, UA: NEGATIVE mg/dL
Hgb urine dipstick: NEGATIVE
Ketones, ur: 5 mg/dL — AB
Nitrite: NEGATIVE
Protein, ur: 100 mg/dL — AB
Specific Gravity, Urine: 1.032 — ABNORMAL HIGH (ref 1.005–1.030)
Squamous Epithelial / HPF: >50 — ABNORMAL HIGH (ref 0–5)
pH: 6 (ref 5.0–8.0)

## 2022-10-31 LAB — COMPREHENSIVE METABOLIC PANEL
ALT: 23 U/L (ref 0–44)
AST: 22 U/L (ref 15–41)
Albumin: 5.2 g/dL — ABNORMAL HIGH (ref 3.5–5.0)
Alkaline Phosphatase: 44 U/L (ref 38–126)
Anion gap: 9 (ref 5–15)
BUN: 9 mg/dL (ref 6–20)
CO2: 20 mmol/L — ABNORMAL LOW (ref 22–32)
Calcium: 9.7 mg/dL (ref 8.9–10.3)
Chloride: 109 mmol/L (ref 98–111)
Creatinine, Ser: 0.65 mg/dL (ref 0.44–1.00)
GFR, Estimated: >60 mL/min (ref >60)
Glucose, Bld: 115 mg/dL — ABNORMAL HIGH (ref 70–99)
Potassium: 3 mmol/L — ABNORMAL LOW (ref 3.5–5.1)
Sodium: 138 mmol/L (ref 135–145)
Total Bilirubin: 1.2 mg/dL (ref 0.3–1.2)
Total Protein: 8.3 g/dL — ABNORMAL HIGH (ref 6.5–8.1)

## 2022-10-31 LAB — CBC WITH DIFFERENTIAL/PLATELET
Abs Immature Granulocytes: 0.03 10*3/uL (ref 0.00–0.07)
Basophils Absolute: 0 10*3/uL (ref 0.0–0.1)
Basophils Relative: 0 %
Eosinophils Absolute: 0 10*3/uL (ref 0.0–0.5)
Eosinophils Relative: 0 %
HCT: 40.5 % (ref 36.0–46.0)
Hemoglobin: 14.5 g/dL (ref 12.0–15.0)
Immature Granulocytes: 0 %
Lymphocytes Relative: 26 %
Lymphs Abs: 2.5 10*3/uL (ref 0.7–4.0)
MCH: 26.7 pg (ref 26.0–34.0)
MCHC: 35.8 g/dL (ref 30.0–36.0)
MCV: 74.4 fL — ABNORMAL LOW (ref 80.0–100.0)
Monocytes Absolute: 1 10*3/uL (ref 0.1–1.0)
Monocytes Relative: 11 %
Neutro Abs: 5.9 10*3/uL (ref 1.7–7.7)
Neutrophils Relative %: 63 %
Platelets: 339 10*3/uL (ref 150–400)
RBC: 5.44 MIL/uL — ABNORMAL HIGH (ref 3.87–5.11)
RDW: 16.2 % — ABNORMAL HIGH (ref 11.5–15.5)
WBC: 9.4 10*3/uL (ref 4.0–10.5)
nRBC: 0 % (ref 0.0–0.2)

## 2022-10-31 LAB — LIPASE, BLOOD: Lipase: 38 U/L (ref 11–51)

## 2022-10-31 LAB — PREGNANCY, URINE: Preg Test, Ur: NEGATIVE

## 2022-10-31 MED ORDER — POTASSIUM CHLORIDE CRYS ER 20 MEQ PO TBCR
40.0000 meq | EXTENDED_RELEASE_TABLET | Freq: Once | ORAL | Status: AC
  Administered 2022-10-31: 40 meq via ORAL
  Filled 2022-10-31: qty 2

## 2022-10-31 MED ORDER — LORAZEPAM 2 MG/ML IJ SOLN
0.5000 mg | Freq: Once | INTRAMUSCULAR | Status: AC
  Administered 2022-10-31: 0.5 mg via INTRAVENOUS
  Filled 2022-10-31: qty 1

## 2022-10-31 MED ORDER — HYDROCHLOROTHIAZIDE 25 MG PO TABS
25.0000 mg | ORAL_TABLET | Freq: Once | ORAL | Status: AC
  Administered 2022-10-31: 25 mg via ORAL
  Filled 2022-10-31: qty 1

## 2022-10-31 MED ORDER — ONDANSETRON HCL 4 MG PO TABS: 4.0000 mg | ORAL_TABLET | Freq: Three times a day (TID) | ORAL | 0 refills | Status: AC | PRN

## 2022-10-31 MED ORDER — POTASSIUM CHLORIDE ER 10 MEQ PO TBCR: 20.0000 meq | EXTENDED_RELEASE_TABLET | Freq: Every day | ORAL | 0 refills | Status: AC

## 2022-10-31 MED ORDER — LACTATED RINGERS IV BOLUS
1000.0000 mL | Freq: Once | INTRAVENOUS | Status: AC
  Administered 2022-10-31: 1000 mL via INTRAVENOUS

## 2022-10-31 MED ORDER — HYDROCHLOROTHIAZIDE 12.5 MG PO CAPS: 25.0000 mg | ORAL_CAPSULE | Freq: Once | ORAL | Status: DC

## 2022-10-31 MED ORDER — DROPERIDOL 2.5 MG/ML IJ SOLN
1.2500 mg | Freq: Once | INTRAMUSCULAR | Status: AC
  Administered 2022-10-31: 1.25 mg via INTRAVENOUS
  Filled 2022-10-31: qty 2

## 2022-10-31 NOTE — ED Provider Notes (Signed)
[facility] DEPT Provider Note   CSN: [id number] Arrival date & time: 10/31/22  7681     History  Chief Complaint  Patient presents with   Vomiting    Kelly Arellano is a 21 y.o. female.  With PMH of HTN and previous visits to the ED for nausea and vomiting x 3 days who presents with nausea and vomiting after reportedly eating expired mac & cheese.  Patient said this past Friday she ate an expired hot [place] and then proceeded to have continued nonbloody nonbilious emesis since Friday.  She has had some nonbloody diarrhea associated with it but last bowel movements have been normal.  She has had no fevers, no cough, no URI symptoms, no pain with urination, no burning with urination, no UTI-like symptoms.  She had some intermittent abdominal cramping but no localization of pain.  She tried looking for nausea medicines at home from previous time she is been in the hospital with vomiting but cannot find any.  She finally decided to come in today to be treated.  She has had no abdominal surgeries.  Her last menstrual period just ended a few days ago.  She denies any vaginal burning discharge or itching.  She does smoke marijuana intermittently but last smoked prior to this happening.  She has hypertension but is not been able to take her blood pressure meds since this is started.  She also intermittently misses her blood pressure meds.  HPI     Home Medications Prior to Admission medications   Medication Sig Start Date End Date Taking? Authorizing Provider  ondansetron (ZOFRAN) 4 MG tablet Take 1 tablet (4 mg total) by mouth every 8 (eight) hours as needed for nausea or vomiting. 10/31/22  Yes Elgie Congo, MD  potassium chloride (KLOR-CON) 10 MEQ tablet Take 2 tablets (20 mEq total) by mouth daily for 3 days. 10/31/22 11/03/22 Yes Elgie Congo, MD  hydrochlorothiazide (HYDRODIURIL) 50 MG tablet Take 50 mg by mouth daily. 03/31/21    [provider]  mometasone (NASONEX) 50 MCG/ACT nasal spray Place 2 sprays into the nose daily as needed (allergies). 01/22/19   [provider]  montelukast (SINGULAIR) 10 MG tablet Take 10 mg by mouth daily.    [provider]  norethindrone-ethinyl estradiol-FE (LOESTRIN FE) 1-20 MG-MCG tablet Take 1 tablet by mouth daily. 05/06/21   [provider]  ondansetron (ZOFRAN-ODT) 4 MG disintegrating tablet 81m ODT q4 hours prn nausea/vomit 07/22/22   ZMilton Ferguson MD  potassium chloride SA (KLOR-CON) 20 MEQ tablet Take 1 tablet (20 mEq total) by mouth 2 (two) times daily for 5 days. Patient not taking: Reported on 07/25/2021 01/08/21 01/13/21  McDonald, MMaree ErieA, PA-C  Prenatal Vit-Fe Fumarate-FA (PRENATAL VITAMINS) 28-0.8 MG TABS Take 1 tablet by mouth daily. Patient not taking: Reported on 07/25/2021 04/21/21   MMelynda Ripple MD  promethazine (PHENERGAN) 25 MG suppository Place 1 suppository (25 mg total) rectally every 6 (six) hours as needed for nausea or vomiting. 07/24/22   Kommor, [place], MD      Allergies    Molds & smuts and Pollen extract    Review of Systems   Review of Systems  Physical Exam Updated Vital Signs BP (!) 147/97   Pulse 65   Temp 98.2 F (36.8 C) (Oral)   Resp 20   Ht _0  (1.626 m)   Wt 59.9 kg   SpO2 100%   BMI 22.66 kg/m  Physical Exam  Constitutional: Alert and oriented.  Emesis bag at side of bed but no longer active vomiting sitting up in bed in no acute distress and calm  eyes: Conjunctivae are normal. ENT      Head: Normocephalic and atraumatic.      Nose: No congestion.      Mouth/Throat: Mucous membranes are moist.      Neck: No stridor. Cardiovascular: S1, S2,  Normal and symmetric distal pulses are present in all extremities.Warm and well perfused. Respiratory: Normal respiratory effort.  O2 sat 100 on RA Gastrointestinal: Soft and nontender. There is no CVA tenderness.  Nondistended and not  peritonitic Musculoskeletal: Normal range of motion in all extremities. Neurologic: Normal speech and language. No gross focal neurologic deficits are appreciated. Skin: Skin is warm, dry and intact. No rash noted. Psychiatric: Mood and affect are normal. Speech and behavior are normal.  ED Results / Procedures / Treatments   Labs (all labs ordered are listed, but only abnormal results are displayed) Labs Reviewed  COMPREHENSIVE METABOLIC PANEL - Abnormal; Notable for the following components:      Result Value   Potassium 3.0 (*)    CO2 20 (*)    Glucose, Bld 115 (*)    Total Protein 8.3 (*)    Albumin 5.2 (*)    All other components within normal limits  URINALYSIS, ROUTINE W REFLEX MICROSCOPIC - Abnormal; Notable for the following components:   Color, Urine AMBER (*)    APPearance CLOUDY (*)    Specific Gravity, Urine 1.032 (*)    Ketones, ur 5 (*)    Protein, ur 100 (*)    Leukocytes,Ua MODERATE (*)    Squamous Epithelial / LPF >50 (*)    All other components within normal limits  CBC WITH DIFFERENTIAL/PLATELET - Abnormal; Notable for the following components:   RBC 5.44 (*)    MCV 74.4 (*)    RDW 16.2 (*)    All other components within normal limits  LIPASE, BLOOD  PREGNANCY, URINE    EKG None  Radiology No results found.  Procedures Procedures  Remain on constant cardiac monitoring which I personally reviewed normal sinus rhythm with normal rates.  Medications Ordered in ED Medications  hydrochlorothiazide (HYDRODIURIL) tablet 25 mg (has no administration in time range)  lactated ringers bolus 1,000 mL (1,000 mLs Intravenous New Bag/Given 10/31/22 0750)  droperidol (INAPSINE) 2.5 MG/ML injection 1.25 mg (1.25 mg Intravenous Given 10/31/22 0745)  LORazepam (ATIVAN) injection 0.5 mg (0.5 mg Intravenous Given 10/31/22 0748)  potassium chloride SA (KLOR-CON M) CR tablet 40 mEq (40 mEq Oral Given 10/31/22 0827)    ED Course/ Medical Decision Making/ A&P                            Medical Decision Making Kelly Arellano is a 21 y.o. female.  With PMH of HTN and previous visits to the ED for nausea and vomiting x 3 days who presents with nausea and vomiting after reportedly eating expired mac & cheese.   Based on patient's history and presentation, suspect likely hyperemesis from cannabinol use versus food poisoning versus viral gastroenteritis.  Her abdomen is extremely benign and nontender on exam, I highly doubt acute appendicitis, colitis or cholecystitis.  I do not think her presentation warrants imaging today.  Her lab workup was generally unremarkable.  She had normal white blood cell count 9.4.  Her creatinine was 0.65 within normal limits.  She had mild  hypokalemia 3.0 and mild decreased bicarbonate 20 likely due to losses from vomiting and decreased p.o. intake.  She had no transaminitis and normal total bilirubin 1.2.  Her lipase 38 within normal limits no concern for pancreatitis.  Her pregnancy test was negative.  Her UA was very dirty there was greater than 50 squames but also ketones and protein with moderate leukocyte esterase and 21-50 WBCs, I doubt UTI especially with no urinary symptoms burning or pain.  She was given IV fluids IV droperidol and IV Ativan with significant improvement of symptoms.  I ordered home dose of HCTZ since she has been missing blood pressure medications.  I am not concerned for any blood pressure/hypertensive emergency based off symptoms and presentation today.  Her blood pressure improved to 147/97 with symptom control alone.  Will discharge patient with Zofran ODT and advised patient to avoid smoking marijuana and close follow-up with PCP with return precautions.  She is in agreement with plans for discharge.  Amount and/or Complexity of Data Reviewed Labs: ordered.  Risk Prescription drug management.    Final Clinical Impression(s) / ED Diagnoses Final diagnoses:  Nausea and vomiting, unspecified vomiting type   Hypokalemia    Rx / DC Orders ED Discharge Orders          Ordered    ondansetron (ZOFRAN) 4 MG tablet  Every 8 hours PRN        10/31/22 0853    potassium chloride (KLOR-CON) 10 MEQ tablet  Daily        10/31/22 0853              Elgie Congo, MD 10/31/22 [phone]

## 2022-10-31 NOTE — ED Notes (Signed)
Pt given water 

## 2022-10-31 NOTE — ED Triage Notes (Signed)
Ate some expired mac and cheese Friday then began vomiting afterwards.   Has been vomiting since Friday afternoon.

## 2022-10-31 NOTE — ED Notes (Signed)
Urine has been collected and sent.

## 2022-10-31 NOTE — Discharge Instructions (Addendum)
You have been seen in the Emergency Department (ED)  today for nausea and vomiting.  Your work up today has not shown a clear cause for your symptoms, but they may be due to a viral infection or food poisoning. You have been prescribed Zofran; please use as prescribed as needed for your nausea.  You have been prescribed potassium pills for slightly lower potassium likely due to your vomiting.  Make sure to take your blood pressure medications as prescribed.  Follow up with your doctor as soon as possible, ideally within one week, regarding today's emergent visit and your symptoms of nausea/vomiting.   Return to the Emergency Department (ED)  if you develop severe abdominal pain, bloody vomiting, bloody diarrhea, if you are unable to tolerate fluids due to vomiting, or if you develop other symptoms that concern you.

## 2022-10-31 NOTE — ED Notes (Signed)
Urine still needs to be collected, Pregnancy urine and urinalysis were clicked off by mistake.

## 2024-02-04 ENCOUNTER — Ambulatory Visit (HOSPITAL_COMMUNITY)
